# Patient Record
Sex: Male | Born: 1950 | Race: White | Hispanic: No | Marital: Married | Smoking: Never smoker
Health system: Southern US, Community
[De-identification: ages and names within clinical notes are randomized; demographics above are authoritative.]

## PROBLEM LIST (undated history)

## (undated) ENCOUNTER — Emergency Department (HOSPITAL_COMMUNITY): Disposition: A | Discharge status: Other Acute Inpatient Hospital | Payer: Self-pay

## (undated) DIAGNOSIS — F32A Depression, unspecified: Secondary | ICD-10-CM

## (undated) DIAGNOSIS — E291 Testicular hypofunction: Secondary | ICD-10-CM

## (undated) DIAGNOSIS — T7840XA Allergy, unspecified, initial encounter: Secondary | ICD-10-CM

## (undated) DIAGNOSIS — J45909 Unspecified asthma, uncomplicated: Secondary | ICD-10-CM

## (undated) DIAGNOSIS — E669 Obesity, unspecified: Secondary | ICD-10-CM

## (undated) DIAGNOSIS — I10 Essential (primary) hypertension: Secondary | ICD-10-CM

## (undated) DIAGNOSIS — H3320 Serous retinal detachment, unspecified eye: Secondary | ICD-10-CM

## (undated) DIAGNOSIS — K649 Unspecified hemorrhoids: Secondary | ICD-10-CM

## (undated) DIAGNOSIS — F329 Major depressive disorder, single episode, unspecified: Secondary | ICD-10-CM

## (undated) DIAGNOSIS — M545 Low back pain, unspecified: Secondary | ICD-10-CM

## (undated) DIAGNOSIS — D126 Benign neoplasm of colon, unspecified: Secondary | ICD-10-CM

## (undated) DIAGNOSIS — J338 Other polyp of sinus: Secondary | ICD-10-CM

## (undated) DIAGNOSIS — R011 Cardiac murmur, unspecified: Secondary | ICD-10-CM

## (undated) DIAGNOSIS — G8929 Other chronic pain: Secondary | ICD-10-CM

## (undated) DIAGNOSIS — F419 Anxiety disorder, unspecified: Secondary | ICD-10-CM

## (undated) DIAGNOSIS — K219 Gastro-esophageal reflux disease without esophagitis: Secondary | ICD-10-CM

## (undated) DIAGNOSIS — J189 Pneumonia, unspecified organism: Secondary | ICD-10-CM

## (undated) DIAGNOSIS — N1330 Unspecified hydronephrosis: Secondary | ICD-10-CM

## (undated) DIAGNOSIS — M1990 Unspecified osteoarthritis, unspecified site: Secondary | ICD-10-CM

## (undated) DIAGNOSIS — R7303 Prediabetes: Secondary | ICD-10-CM

## (undated) DIAGNOSIS — Z8679 Personal history of other diseases of the circulatory system: Secondary | ICD-10-CM

## (undated) DIAGNOSIS — G4733 Obstructive sleep apnea (adult) (pediatric): Secondary | ICD-10-CM

## (undated) HISTORY — PX: TONSILLECTOMY AND ADENOIDECTOMY: SUR1326

## (undated) HISTORY — DX: Unspecified asthma, uncomplicated: J45.909

## (undated) HISTORY — PX: RETINAL DETACHMENT SURGERY: SHX105

## (undated) HISTORY — DX: Obstructive sleep apnea (adult) (pediatric): G47.33

## (undated) HISTORY — DX: Testicular hypofunction: E29.1

## (undated) HISTORY — DX: Unspecified hemorrhoids: K64.9

## (undated) HISTORY — DX: Allergy, unspecified, initial encounter: T78.40XA

## (undated) HISTORY — DX: Other polyp of sinus: J33.8

## (undated) HISTORY — PX: COLONOSCOPY W/ BIOPSIES: SHX1374

## (undated) HISTORY — PX: VASECTOMY: SHX75

## (undated) HISTORY — DX: Anxiety disorder, unspecified: F41.9

## (undated) HISTORY — PX: URETHROTOMY: SHX1083

## (undated) HISTORY — DX: Depression, unspecified: F32.A

## (undated) HISTORY — DX: Unspecified osteoarthritis, unspecified site: M19.90

## (undated) HISTORY — PX: NASAL RECONSTRUCTION WITH SEPTAL REPAIR: SHX5665

## (undated) HISTORY — DX: Gastro-esophageal reflux disease without esophagitis: K21.9

## (undated) HISTORY — DX: Benign neoplasm of colon, unspecified: D12.6

## (undated) HISTORY — DX: Essential (primary) hypertension: I10

## (undated) HISTORY — PX: CARPAL TUNNEL RELEASE: SHX101

## (undated) HISTORY — DX: Serous retinal detachment, unspecified eye: H33.20

## (undated) HISTORY — DX: Obesity, unspecified: E66.9

## (undated) HISTORY — DX: Major depressive disorder, single episode, unspecified: F32.9

---

## 1998-03-30 ENCOUNTER — Ambulatory Visit (HOSPITAL_COMMUNITY): Admission: RE | Admit: 1998-03-30 | Discharge: 1998-03-30 | Payer: Self-pay | Admitting: Family Medicine

## 2002-11-12 HISTORY — PX: CATARACT EXTRACTION W/ INTRAOCULAR LENS  IMPLANT, BILATERAL: SHX1307

## 2003-02-26 ENCOUNTER — Emergency Department (HOSPITAL_COMMUNITY): Admission: EM | Admit: 2003-02-26 | Discharge: 2003-02-26 | Payer: Self-pay | Admitting: Emergency Medicine

## 2003-02-26 ENCOUNTER — Encounter: Payer: Self-pay | Admitting: *Deleted

## 2003-03-21 ENCOUNTER — Emergency Department (HOSPITAL_COMMUNITY): Admission: EM | Admit: 2003-03-21 | Discharge: 2003-03-21 | Payer: Self-pay | Admitting: Emergency Medicine

## 2003-04-08 ENCOUNTER — Ambulatory Visit (HOSPITAL_BASED_OUTPATIENT_CLINIC_OR_DEPARTMENT_OTHER): Admission: RE | Admit: 2003-04-08 | Discharge: 2003-04-08 | Payer: Self-pay | Admitting: Critical Care Medicine

## 2004-01-05 ENCOUNTER — Ambulatory Visit (HOSPITAL_COMMUNITY): Admission: RE | Admit: 2004-01-05 | Discharge: 2004-01-05 | Payer: Self-pay | Admitting: Endocrinology

## 2004-09-13 ENCOUNTER — Ambulatory Visit: Payer: Self-pay | Admitting: Internal Medicine

## 2004-09-14 ENCOUNTER — Ambulatory Visit: Payer: Self-pay

## 2004-09-29 ENCOUNTER — Ambulatory Visit: Payer: Self-pay | Admitting: Internal Medicine

## 2004-10-12 ENCOUNTER — Ambulatory Visit: Payer: Self-pay | Admitting: Internal Medicine

## 2004-12-15 ENCOUNTER — Ambulatory Visit: Payer: Self-pay | Admitting: Internal Medicine

## 2004-12-18 ENCOUNTER — Ambulatory Visit (HOSPITAL_COMMUNITY): Admission: RE | Admit: 2004-12-18 | Discharge: 2004-12-18 | Payer: Self-pay | Admitting: Internal Medicine

## 2005-01-31 ENCOUNTER — Ambulatory Visit: Payer: Self-pay | Admitting: Internal Medicine

## 2005-02-12 ENCOUNTER — Ambulatory Visit: Payer: Self-pay | Admitting: Internal Medicine

## 2005-02-19 ENCOUNTER — Ambulatory Visit: Payer: Self-pay | Admitting: Internal Medicine

## 2005-02-21 ENCOUNTER — Encounter: Admission: RE | Admit: 2005-02-21 | Discharge: 2005-02-21 | Payer: Self-pay | Admitting: Internal Medicine

## 2005-02-21 ENCOUNTER — Ambulatory Visit: Payer: Self-pay | Admitting: Internal Medicine

## 2005-04-27 ENCOUNTER — Ambulatory Visit: Payer: Self-pay | Admitting: Internal Medicine

## 2005-05-09 ENCOUNTER — Encounter: Admission: RE | Admit: 2005-05-09 | Discharge: 2005-05-09 | Payer: Self-pay | Admitting: Otolaryngology

## 2005-05-09 ENCOUNTER — Ambulatory Visit: Payer: Self-pay | Admitting: Internal Medicine

## 2005-05-14 ENCOUNTER — Encounter (INDEPENDENT_AMBULATORY_CARE_PROVIDER_SITE_OTHER): Payer: Self-pay | Admitting: *Deleted

## 2005-05-14 ENCOUNTER — Ambulatory Visit (HOSPITAL_COMMUNITY): Admission: RE | Admit: 2005-05-14 | Discharge: 2005-05-15 | Payer: Self-pay | Admitting: Otolaryngology

## 2005-05-14 HISTORY — PX: NASAL SEPTUM SURGERY: SHX37

## 2005-06-12 ENCOUNTER — Ambulatory Visit: Payer: Self-pay | Admitting: Internal Medicine

## 2005-08-02 ENCOUNTER — Ambulatory Visit: Payer: Self-pay | Admitting: Internal Medicine

## 2005-09-05 ENCOUNTER — Ambulatory Visit: Payer: Self-pay | Admitting: Internal Medicine

## 2005-09-25 ENCOUNTER — Ambulatory Visit: Payer: Self-pay | Admitting: Internal Medicine

## 2005-10-15 ENCOUNTER — Ambulatory Visit: Payer: Self-pay | Admitting: Internal Medicine

## 2005-10-24 ENCOUNTER — Ambulatory Visit: Payer: Self-pay | Admitting: Internal Medicine

## 2005-12-10 ENCOUNTER — Ambulatory Visit: Payer: Self-pay | Admitting: Internal Medicine

## 2006-02-14 ENCOUNTER — Ambulatory Visit: Payer: Self-pay | Admitting: Internal Medicine

## 2006-02-19 ENCOUNTER — Ambulatory Visit: Payer: Self-pay | Admitting: Internal Medicine

## 2006-04-01 ENCOUNTER — Ambulatory Visit: Payer: Self-pay | Admitting: Internal Medicine

## 2006-04-30 ENCOUNTER — Ambulatory Visit: Payer: Self-pay | Admitting: Internal Medicine

## 2006-06-13 ENCOUNTER — Ambulatory Visit: Payer: Self-pay | Admitting: Internal Medicine

## 2006-08-06 ENCOUNTER — Ambulatory Visit: Payer: Self-pay | Admitting: Internal Medicine

## 2006-08-24 ENCOUNTER — Ambulatory Visit (HOSPITAL_COMMUNITY): Admission: RE | Admit: 2006-08-24 | Discharge: 2006-08-24 | Payer: Self-pay | Admitting: Family Medicine

## 2006-08-24 ENCOUNTER — Ambulatory Visit: Payer: Self-pay | Admitting: Family Medicine

## 2006-08-27 ENCOUNTER — Ambulatory Visit: Payer: Self-pay | Admitting: Internal Medicine

## 2006-09-09 ENCOUNTER — Ambulatory Visit (HOSPITAL_COMMUNITY): Admission: RE | Admit: 2006-09-09 | Discharge: 2006-09-09 | Payer: Self-pay | Admitting: General Practice

## 2006-09-09 ENCOUNTER — Ambulatory Visit: Payer: Self-pay | Admitting: Internal Medicine

## 2006-09-27 ENCOUNTER — Ambulatory Visit: Payer: Self-pay | Admitting: Internal Medicine

## 2006-12-12 ENCOUNTER — Ambulatory Visit: Payer: Self-pay | Admitting: Internal Medicine

## 2007-02-05 ENCOUNTER — Ambulatory Visit: Payer: Self-pay | Admitting: Internal Medicine

## 2007-02-05 LAB — CONVERTED CEMR LAB
ALT: 32 units/L (ref 0–40)
Albumin: 3.6 g/dL (ref 3.5–5.2)
Alkaline Phosphatase: 47 units/L (ref 39–117)
BUN: 16 mg/dL (ref 6–23)
Basophils Absolute: 0.1 10*3/uL (ref 0.0–0.1)
Basophils Relative: 1.1 % — ABNORMAL HIGH (ref 0.0–1.0)
Bilirubin Urine: NEGATIVE
CO2: 27 meq/L (ref 19–32)
Calcium: 8.9 mg/dL (ref 8.4–10.5)
Crystals: NEGATIVE
GFR calc Af Amer: 129 mL/min
GFR calc non Af Amer: 106 mL/min
Lymphocytes Relative: 26.2 % (ref 12.0–46.0)
MCHC: 34.4 g/dL (ref 30.0–36.0)
Monocytes Relative: 10.7 % (ref 3.0–11.0)
Neutro Abs: 2.6 10*3/uL (ref 1.4–7.7)
Platelets: 265 10*3/uL (ref 150–400)
Potassium: 4.3 meq/L (ref 3.5–5.1)
Specific Gravity, Urine: >=1.03 (ref 1.000–1.03)
TSH: 2.61 microintl units/mL (ref 0.35–5.50)
Total CHOL/HDL Ratio: 3.6
Total Protein, Urine: NEGATIVE mg/dL
Triglycerides: 87 mg/dL (ref 0–149)
Urine Glucose: NEGATIVE mg/dL
VLDL: 17 mg/dL (ref 0–40)
pH: 6 (ref 5.0–8.0)

## 2007-02-12 ENCOUNTER — Ambulatory Visit: Payer: Self-pay | Admitting: Internal Medicine

## 2007-04-07 ENCOUNTER — Emergency Department (HOSPITAL_COMMUNITY): Admission: EM | Admit: 2007-04-07 | Discharge: 2007-04-07 | Payer: Self-pay | Admitting: Emergency Medicine

## 2007-04-08 ENCOUNTER — Ambulatory Visit: Payer: Self-pay | Admitting: Internal Medicine

## 2007-04-16 ENCOUNTER — Ambulatory Visit: Payer: Self-pay | Admitting: Internal Medicine

## 2007-05-02 ENCOUNTER — Ambulatory Visit: Payer: Self-pay | Admitting: Internal Medicine

## 2007-05-22 ENCOUNTER — Ambulatory Visit: Payer: Self-pay | Admitting: Internal Medicine

## 2007-05-27 ENCOUNTER — Ambulatory Visit: Payer: Self-pay | Admitting: Internal Medicine

## 2007-06-05 ENCOUNTER — Ambulatory Visit: Payer: Self-pay | Admitting: Internal Medicine

## 2007-06-05 LAB — CONVERTED CEMR LAB
BUN: 21 mg/dL (ref 6–23)
CO2: 31 meq/L (ref 19–32)
Calcium: 9.3 mg/dL (ref 8.4–10.5)
Chloride: 102 meq/L (ref 96–112)
Creatinine, Ser: 1 mg/dL (ref 0.4–1.5)
Hgb A1c MFr Bld: 5.7 % (ref 4.6–6.0)
Vit D, 1,25-Dihydroxy: 27 (ref 20–57)

## 2007-06-11 ENCOUNTER — Ambulatory Visit: Payer: Self-pay | Admitting: Internal Medicine

## 2007-06-18 ENCOUNTER — Ambulatory Visit: Payer: Self-pay | Admitting: Internal Medicine

## 2007-07-09 ENCOUNTER — Ambulatory Visit: Payer: Self-pay | Admitting: Internal Medicine

## 2007-07-30 ENCOUNTER — Ambulatory Visit: Payer: Self-pay | Admitting: Internal Medicine

## 2007-09-09 ENCOUNTER — Ambulatory Visit: Payer: Self-pay | Admitting: Internal Medicine

## 2007-10-17 ENCOUNTER — Telehealth: Payer: Self-pay | Admitting: Internal Medicine

## 2007-10-21 ENCOUNTER — Encounter: Payer: Self-pay | Admitting: Internal Medicine

## 2007-12-22 ENCOUNTER — Telehealth: Payer: Self-pay | Admitting: Internal Medicine

## 2008-01-14 ENCOUNTER — Ambulatory Visit: Payer: Self-pay | Admitting: Internal Medicine

## 2008-01-14 LAB — CONVERTED CEMR LAB
ALT: 22 units/L (ref 0–53)
Basophils Relative: 1.1 % — ABNORMAL HIGH (ref 0.0–1.0)
Bilirubin Urine: NEGATIVE
Bilirubin, Direct: 0.2 mg/dL (ref 0.0–0.3)
CO2: 29 meq/L (ref 19–32)
Cholesterol: 157 mg/dL (ref 0–200)
Crystals: NEGATIVE
Eosinophils Absolute: 0.4 10*3/uL (ref 0.0–0.6)
Eosinophils Relative: 5.7 % — ABNORMAL HIGH (ref 0.0–5.0)
GFR calc Af Amer: 99 mL/min
GFR calc non Af Amer: 82 mL/min
Glucose, Bld: 100 mg/dL — ABNORMAL HIGH (ref 70–99)
Hemoglobin: 13.7 g/dL (ref 13.0–17.0)
Lymphocytes Relative: 24.8 % (ref 12.0–46.0)
MCV: 95.9 fL (ref 78.0–100.0)
Monocytes Absolute: 0.5 10*3/uL (ref 0.2–0.7)
Mucus, UA: NEGATIVE
Neutro Abs: 4 10*3/uL (ref 1.4–7.7)
Nitrite: NEGATIVE
Platelets: 244 10*3/uL (ref 150–400)
Potassium: 4.9 meq/L (ref 3.5–5.1)
Sodium: 138 meq/L (ref 135–145)
Specific Gravity, Urine: 1.01 (ref 1.000–1.03)
TSH: 1.6 microintl units/mL (ref 0.35–5.50)
Total CHOL/HDL Ratio: 2.6
Total Protein, Urine: NEGATIVE mg/dL
Total Protein: 6.7 g/dL (ref 6.0–8.3)
Triglycerides: 85 mg/dL (ref 0–149)
Urine Glucose: NEGATIVE mg/dL
WBC: 6.6 10*3/uL (ref 4.5–10.5)

## 2008-01-19 ENCOUNTER — Ambulatory Visit: Payer: Self-pay | Admitting: Internal Medicine

## 2008-01-19 DIAGNOSIS — I10 Essential (primary) hypertension: Secondary | ICD-10-CM

## 2008-01-19 DIAGNOSIS — E119 Type 2 diabetes mellitus without complications: Secondary | ICD-10-CM

## 2008-01-19 DIAGNOSIS — K219 Gastro-esophageal reflux disease without esophagitis: Secondary | ICD-10-CM | POA: Insufficient documentation

## 2008-01-19 DIAGNOSIS — M545 Low back pain: Secondary | ICD-10-CM

## 2008-03-09 ENCOUNTER — Ambulatory Visit: Payer: Self-pay | Admitting: Internal Medicine

## 2008-03-09 DIAGNOSIS — N529 Male erectile dysfunction, unspecified: Secondary | ICD-10-CM | POA: Insufficient documentation

## 2008-03-10 LAB — CONVERTED CEMR LAB
BUN: 19 mg/dL (ref 6–23)
GFR calc Af Amer: 112 mL/min
GFR calc non Af Amer: 92 mL/min
Potassium: 4.1 meq/L (ref 3.5–5.1)
Sodium: 142 meq/L (ref 135–145)
Vitamin B-12: 546 pg/mL (ref 211–911)

## 2008-03-29 ENCOUNTER — Ambulatory Visit: Payer: Self-pay | Admitting: Internal Medicine

## 2008-03-29 DIAGNOSIS — R079 Chest pain, unspecified: Secondary | ICD-10-CM

## 2008-04-07 ENCOUNTER — Encounter: Admission: RE | Admit: 2008-04-07 | Discharge: 2008-04-07 | Payer: Self-pay | Admitting: Internal Medicine

## 2008-04-19 ENCOUNTER — Telehealth (INDEPENDENT_AMBULATORY_CARE_PROVIDER_SITE_OTHER): Payer: Self-pay | Admitting: *Deleted

## 2008-04-22 ENCOUNTER — Telehealth: Payer: Self-pay | Admitting: Internal Medicine

## 2008-06-09 ENCOUNTER — Ambulatory Visit: Payer: Self-pay | Admitting: Internal Medicine

## 2008-07-21 ENCOUNTER — Ambulatory Visit: Payer: Self-pay | Admitting: Internal Medicine

## 2008-08-24 ENCOUNTER — Ambulatory Visit: Payer: Self-pay | Admitting: Internal Medicine

## 2008-08-27 ENCOUNTER — Ambulatory Visit: Payer: Self-pay | Admitting: Internal Medicine

## 2008-09-27 ENCOUNTER — Ambulatory Visit: Payer: Self-pay | Admitting: Internal Medicine

## 2008-10-25 ENCOUNTER — Telehealth: Payer: Self-pay | Admitting: Internal Medicine

## 2008-10-27 ENCOUNTER — Ambulatory Visit: Payer: Self-pay | Admitting: Internal Medicine

## 2008-11-22 ENCOUNTER — Telehealth: Payer: Self-pay | Admitting: Internal Medicine

## 2008-12-28 ENCOUNTER — Telehealth: Payer: Self-pay | Admitting: Internal Medicine

## 2009-01-19 ENCOUNTER — Ambulatory Visit: Payer: Self-pay | Admitting: Internal Medicine

## 2009-01-19 LAB — CONVERTED CEMR LAB
ALT: 27 units/L (ref 0–53)
Albumin: 3.9 g/dL (ref 3.5–5.2)
BUN: 11 mg/dL (ref 6–23)
Basophils Relative: 0.5 % (ref 0.0–3.0)
CO2: 28 meq/L (ref 19–32)
Calcium: 9.6 mg/dL (ref 8.4–10.5)
Cholesterol: 205 mg/dL (ref 0–200)
Creatinine, Ser: 0.7 mg/dL (ref 0.4–1.5)
Direct LDL: 70.7 mg/dL
Glucose, Bld: 108 mg/dL — ABNORMAL HIGH (ref 70–99)
HCT: 41.6 % (ref 39.0–52.0)
Hemoglobin: 14.4 g/dL (ref 13.0–17.0)
Ketones, ur: NEGATIVE mg/dL
Leukocytes, UA: NEGATIVE
Lymphocytes Relative: 32.5 % (ref 12.0–46.0)
Monocytes Relative: 11 % (ref 3.0–12.0)
Neutro Abs: 2.1 10*3/uL (ref 1.4–7.7)
Nitrite: NEGATIVE
PSA: 0.31 ng/mL (ref 0.10–4.00)
RBC: 4.3 M/uL (ref 4.22–5.81)
Specific Gravity, Urine: 1.015 (ref 1.000–1.035)
Total CHOL/HDL Ratio: 1.9
Total Protein, Urine: NEGATIVE mg/dL
Total Protein: 7.2 g/dL (ref 6.0–8.3)
pH: 7 (ref 5.0–8.0)

## 2009-01-20 ENCOUNTER — Ambulatory Visit: Payer: Self-pay | Admitting: Internal Medicine

## 2009-01-26 ENCOUNTER — Ambulatory Visit: Payer: Self-pay | Admitting: Internal Medicine

## 2009-01-26 DIAGNOSIS — R209 Unspecified disturbances of skin sensation: Secondary | ICD-10-CM

## 2009-02-02 ENCOUNTER — Ambulatory Visit: Payer: Self-pay | Admitting: Internal Medicine

## 2009-02-23 ENCOUNTER — Telehealth: Payer: Self-pay | Admitting: Internal Medicine

## 2009-03-21 ENCOUNTER — Telehealth: Payer: Self-pay | Admitting: Internal Medicine

## 2009-04-22 ENCOUNTER — Telehealth: Payer: Self-pay | Admitting: Internal Medicine

## 2009-05-23 ENCOUNTER — Ambulatory Visit: Payer: Self-pay | Admitting: Internal Medicine

## 2009-05-23 DIAGNOSIS — F411 Generalized anxiety disorder: Secondary | ICD-10-CM | POA: Insufficient documentation

## 2009-05-23 DIAGNOSIS — F329 Major depressive disorder, single episode, unspecified: Secondary | ICD-10-CM

## 2009-05-25 LAB — CONVERTED CEMR LAB
AST: 26 units/L (ref 0–37)
Albumin: 3.5 g/dL (ref 3.5–5.2)
BUN: 14 mg/dL (ref 6–23)
Calcium: 9.2 mg/dL (ref 8.4–10.5)
GFR calc non Af Amer: 122.95 mL/min (ref >60)
Glucose, Bld: 103 mg/dL — ABNORMAL HIGH (ref 70–99)
Ketones, ur: NEGATIVE mg/dL
Leukocytes, UA: NEGATIVE
Potassium: 4.4 meq/L (ref 3.5–5.1)
Specific Gravity, Urine: 1.02 (ref 1.000–1.030)
TSH: 2.25 microintl units/mL (ref 0.35–5.50)
Urobilinogen, UA: 0.2 (ref 0.0–1.0)

## 2009-06-14 ENCOUNTER — Ambulatory Visit: Payer: Self-pay | Admitting: Internal Medicine

## 2009-06-14 DIAGNOSIS — R21 Rash and other nonspecific skin eruption: Secondary | ICD-10-CM

## 2009-06-22 ENCOUNTER — Ambulatory Visit: Payer: Self-pay | Admitting: Internal Medicine

## 2009-06-22 DIAGNOSIS — M722 Plantar fascial fibromatosis: Secondary | ICD-10-CM

## 2009-07-22 ENCOUNTER — Telehealth: Payer: Self-pay | Admitting: Internal Medicine

## 2009-08-24 ENCOUNTER — Ambulatory Visit: Payer: Self-pay | Admitting: Internal Medicine

## 2009-08-24 DIAGNOSIS — M25569 Pain in unspecified knee: Secondary | ICD-10-CM

## 2009-08-24 DIAGNOSIS — F17200 Nicotine dependence, unspecified, uncomplicated: Secondary | ICD-10-CM

## 2009-09-29 ENCOUNTER — Ambulatory Visit: Payer: Self-pay | Admitting: Internal Medicine

## 2009-09-29 DIAGNOSIS — R062 Wheezing: Secondary | ICD-10-CM | POA: Insufficient documentation

## 2009-10-19 ENCOUNTER — Ambulatory Visit: Payer: Self-pay | Admitting: Internal Medicine

## 2009-10-19 LAB — CONVERTED CEMR LAB
BUN: 12 mg/dL (ref 6–23)
CO2: 33 meq/L — ABNORMAL HIGH (ref 19–32)
Calcium: 9.5 mg/dL (ref 8.4–10.5)
Creatinine, Ser: 0.9 mg/dL (ref 0.4–1.5)
Glucose, Bld: 94 mg/dL (ref 70–99)

## 2009-10-24 ENCOUNTER — Ambulatory Visit: Payer: Self-pay | Admitting: Internal Medicine

## 2009-10-29 ENCOUNTER — Ambulatory Visit: Payer: Self-pay | Admitting: Internal Medicine

## 2009-10-31 ENCOUNTER — Ambulatory Visit: Payer: Self-pay | Admitting: Internal Medicine

## 2009-11-16 ENCOUNTER — Ambulatory Visit: Payer: Self-pay | Admitting: Internal Medicine

## 2009-11-16 DIAGNOSIS — R1310 Dysphagia, unspecified: Secondary | ICD-10-CM | POA: Insufficient documentation

## 2009-12-22 ENCOUNTER — Telehealth: Payer: Self-pay | Admitting: Internal Medicine

## 2010-01-19 ENCOUNTER — Telehealth: Payer: Self-pay | Admitting: Internal Medicine

## 2010-01-19 ENCOUNTER — Ambulatory Visit: Payer: Self-pay | Admitting: Internal Medicine

## 2010-01-19 LAB — CONVERTED CEMR LAB
BUN: 16 mg/dL (ref 6–23)
Basophils Relative: 1.2 % (ref 0.0–3.0)
Bilirubin Urine: NEGATIVE
Calcium: 9 mg/dL (ref 8.4–10.5)
Cholesterol: 184 mg/dL (ref 0–200)
Creatinine, Ser: 0.9 mg/dL (ref 0.4–1.5)
Eosinophils Absolute: 0.4 10*3/uL (ref 0.0–0.7)
GFR calc non Af Amer: 91.79 mL/min (ref >60)
HCT: 40.1 % (ref 39.0–52.0)
Hgb A1c MFr Bld: 5.5 % (ref 4.6–6.5)
LDL Cholesterol: 100 mg/dL — ABNORMAL HIGH (ref 0–99)
Leukocytes, UA: NEGATIVE
Lymphs Abs: 1.4 10*3/uL (ref 0.7–4.0)
MCHC: 33.6 g/dL (ref 30.0–36.0)
MCV: 98 fL (ref 78.0–100.0)
Monocytes Absolute: 0.7 10*3/uL (ref 0.1–1.0)
Neutrophils Relative %: 51.4 % (ref 43.0–77.0)
Nitrite: NEGATIVE
Platelets: 267 10*3/uL (ref 150.0–400.0)
Total Bilirubin: 0.6 mg/dL (ref 0.3–1.2)
Triglycerides: 87 mg/dL (ref 0.0–149.0)
Urine Glucose: NEGATIVE mg/dL
VLDL: 17.4 mg/dL (ref 0.0–40.0)

## 2010-01-27 ENCOUNTER — Ambulatory Visit: Payer: Self-pay | Admitting: Internal Medicine

## 2010-01-31 ENCOUNTER — Ambulatory Visit: Payer: Self-pay | Admitting: Internal Medicine

## 2010-02-02 LAB — CONVERTED CEMR LAB
Bilirubin Urine: NEGATIVE
Hemoglobin, Urine: NEGATIVE
Leukocytes, UA: NEGATIVE
Nitrite: NEGATIVE
pH: 6.5 (ref 5.0–8.0)

## 2010-02-20 ENCOUNTER — Telehealth: Payer: Self-pay | Admitting: Internal Medicine

## 2010-03-14 ENCOUNTER — Ambulatory Visit: Payer: Self-pay | Admitting: Internal Medicine

## 2010-03-14 DIAGNOSIS — M79609 Pain in unspecified limb: Secondary | ICD-10-CM

## 2010-03-31 ENCOUNTER — Ambulatory Visit: Payer: Self-pay | Admitting: Internal Medicine

## 2010-03-31 DIAGNOSIS — G4733 Obstructive sleep apnea (adult) (pediatric): Secondary | ICD-10-CM

## 2010-04-03 LAB — CONVERTED CEMR LAB
Albumin: 3.8 g/dL (ref 3.5–5.2)
Alkaline Phosphatase: 50 units/L (ref 39–117)
BUN: 14 mg/dL (ref 6–23)
Basophils Absolute: 0 10*3/uL (ref 0.0–0.1)
Bilirubin, Direct: 0.1 mg/dL (ref 0.0–0.3)
Calcium: 9.1 mg/dL (ref 8.4–10.5)
GFR calc non Af Amer: 113.21 mL/min (ref >60)
Glucose, Bld: 89 mg/dL (ref 70–99)
Lymphocytes Relative: 27.4 % (ref 12.0–46.0)
Lymphs Abs: 1.7 10*3/uL (ref 0.7–4.0)
Monocytes Relative: 10.4 % (ref 3.0–12.0)
Neutrophils Relative %: 56.1 % (ref 43.0–77.0)
Nitrite: NEGATIVE
Platelets: 251 10*3/uL (ref 150.0–400.0)
Pro B Natriuretic peptide (BNP): 49.6 pg/mL (ref 0.0–100.0)
RDW: 11.9 % (ref 11.5–14.6)
Total Protein, Urine: NEGATIVE mg/dL

## 2010-04-11 ENCOUNTER — Telehealth: Payer: Self-pay | Admitting: Internal Medicine

## 2010-04-11 ENCOUNTER — Encounter: Payer: Self-pay | Admitting: Internal Medicine

## 2010-04-13 ENCOUNTER — Encounter: Payer: Self-pay | Admitting: Internal Medicine

## 2010-04-13 ENCOUNTER — Ambulatory Visit: Payer: Self-pay | Admitting: Pulmonary Disease

## 2010-04-19 ENCOUNTER — Telehealth: Payer: Self-pay | Admitting: Internal Medicine

## 2010-05-22 ENCOUNTER — Ambulatory Visit: Payer: Self-pay | Admitting: Internal Medicine

## 2010-05-22 ENCOUNTER — Telehealth: Payer: Self-pay | Admitting: Internal Medicine

## 2010-05-24 ENCOUNTER — Ambulatory Visit (HOSPITAL_BASED_OUTPATIENT_CLINIC_OR_DEPARTMENT_OTHER): Admission: RE | Admit: 2010-05-24 | Discharge: 2010-05-24 | Payer: Self-pay | Admitting: Pulmonary Disease

## 2010-05-24 ENCOUNTER — Encounter: Payer: Self-pay | Admitting: Pulmonary Disease

## 2010-06-01 ENCOUNTER — Ambulatory Visit: Payer: Self-pay | Admitting: Pulmonary Disease

## 2010-06-13 ENCOUNTER — Telehealth (INDEPENDENT_AMBULATORY_CARE_PROVIDER_SITE_OTHER): Payer: Self-pay | Admitting: *Deleted

## 2010-06-19 ENCOUNTER — Telehealth: Payer: Self-pay | Admitting: Internal Medicine

## 2010-06-28 ENCOUNTER — Telehealth: Payer: Self-pay | Admitting: Internal Medicine

## 2010-07-13 ENCOUNTER — Telehealth: Payer: Self-pay | Admitting: Internal Medicine

## 2010-07-13 ENCOUNTER — Ambulatory Visit: Payer: Self-pay | Admitting: Pulmonary Disease

## 2010-08-04 ENCOUNTER — Ambulatory Visit: Payer: Self-pay | Admitting: Internal Medicine

## 2010-08-08 ENCOUNTER — Encounter: Payer: Self-pay | Admitting: Internal Medicine

## 2010-08-18 ENCOUNTER — Telehealth: Payer: Self-pay | Admitting: Internal Medicine

## 2010-08-31 ENCOUNTER — Telehealth: Payer: Self-pay | Admitting: Internal Medicine

## 2010-09-11 ENCOUNTER — Encounter: Payer: Self-pay | Admitting: Internal Medicine

## 2010-09-15 ENCOUNTER — Telehealth: Payer: Self-pay | Admitting: Internal Medicine

## 2010-10-20 ENCOUNTER — Telehealth: Payer: Self-pay | Admitting: Internal Medicine

## 2010-10-20 ENCOUNTER — Ambulatory Visit: Payer: Self-pay | Admitting: Endocrinology

## 2010-10-20 ENCOUNTER — Encounter: Payer: Self-pay | Admitting: Endocrinology

## 2010-10-20 DIAGNOSIS — N63 Unspecified lump in unspecified breast: Secondary | ICD-10-CM

## 2010-10-20 DIAGNOSIS — E291 Testicular hypofunction: Secondary | ICD-10-CM | POA: Insufficient documentation

## 2010-10-20 LAB — CONVERTED CEMR LAB
Estradiol: 19.9 pg/mL
LH: 6.2 milliintl units/mL (ref 1.5–9.3)
Prolactin: 4.5 ng/mL (ref 2.1–17.1)
hCG, Beta Chain, Quant, S: <2 milliintl units/mL

## 2010-11-02 ENCOUNTER — Encounter
Admission: RE | Admit: 2010-11-02 | Discharge: 2010-11-02 | Payer: Self-pay | Source: Home / Self Care | Attending: Endocrinology | Admitting: Endocrinology

## 2010-11-07 ENCOUNTER — Encounter: Payer: Self-pay | Admitting: Endocrinology

## 2010-11-15 ENCOUNTER — Ambulatory Visit
Admission: RE | Admit: 2010-11-15 | Discharge: 2010-11-15 | Payer: Self-pay | Source: Home / Self Care | Attending: Endocrinology | Admitting: Endocrinology

## 2010-12-06 ENCOUNTER — Ambulatory Visit
Admission: RE | Admit: 2010-12-06 | Discharge: 2010-12-06 | Payer: Self-pay | Source: Home / Self Care | Attending: Internal Medicine | Admitting: Internal Medicine

## 2010-12-06 DIAGNOSIS — R413 Other amnesia: Secondary | ICD-10-CM | POA: Insufficient documentation

## 2010-12-14 ENCOUNTER — Encounter: Payer: Self-pay | Admitting: Pulmonary Disease

## 2010-12-14 NOTE — Progress Notes (Signed)
Summary: PA--Omeprazole  Phone Note From Pharmacy   Summary of Call: PA request--Omeprazole. Form completed and faxed, will await insurance company reply. Initial call taken by: Lucious Groves,  Apr 11, 2010 9:06 AM     Appended Document: PA--Omeprazole approved until 03/2011.

## 2010-12-14 NOTE — Progress Notes (Signed)
Summary: ORDER  Phone Note Call from Patient   Caller: Patient Call For: SOOD Summary of Call: NEED ORDER SENT TO [facility] FOR C PAP MACHINE , HE DOES NOT WANT TO USE [facility] . Initial call taken by: Rickard [name],  June 13, 2010 2:43 PM  Follow-up for Phone Call        will send message to [facility] to address.  Per pt's chart, it looks like VS sent order to [facility] 05-24-2010 to get set up with a DME company along with cpap and supplies.  Looks like order was sent to [facility].  Pt wishing to go to [facility] instead.  PCCs, please advise.  Thanks.  Aundra Millet Reynolds LPN  June 13, 2010 2:57 PM   disregard - pt took care of problem.  per pt. Follow-up by: Eugene Gavia,  June 13, 2010 3:22 PM

## 2010-12-14 NOTE — Progress Notes (Signed)
Summary: Oxycodone   Phone Note Refill Request Message from:  Patient on July 13, 2010 8:40 AM  Refills Requested: Medication #1:  OXYCODONE HCL 15 MG TABS 1 by mouth four times a day   Supply Requested: 1 month has appt with Dr Craige Cotta this am and will come back after appt to pick up rx    Method Requested: Pick up at Office Initial call taken by: Migdalia Dk,  July 13, 2010 8:41 AM  Follow-up for Phone Call        #120 was given on 06-20-10, please advise. Lucious Groves CMA  July 13, 2010 9:09 AM   Additional Follow-up for Phone Call Additional follow up Details #1::        ok to fill on 9-8 Additional Follow-up by: Tresa Garter MD,  July 13, 2010 10:16 AM    Additional Follow-up for Phone Call Additional follow up Details #2::    Pt informed to pick up rx Follow-up by: Lamar Sprinkles, CMA,  July 13, 2010 1:19 PM  New/Updated Medications: OXYCODONE HCL 15 MG TABS (OXYCODONE HCL) 1 by mouth four times a day Fill on or after 07/20/2010 Prescriptions: OXYCODONE HCL 15 MG TABS (OXYCODONE HCL) 1 by mouth four times a day Fill on or after 07/20/2010  #120 x 0   Entered by:   Lamar Sprinkles, CMA   Authorized by:   Tresa Garter MD   Signed by:   Lamar Sprinkles, CMA on 07/13/2010   Method used:   Print then Give to Patient   RxID:   [id number]

## 2010-12-14 NOTE — Assessment & Plan Note (Signed)
Summary: EXTREME FATIGUE/PLOT/PLOT VAC/CD   Vital Signs:  Patient profile:   60 year old male Height:      67 inches Weight:      295.75 pounds BMI:     46.49 O2 Sat:      96 % on Room air Temp:     98.5 degrees F oral Pulse rate:   104 / minute BP sitting:   128 / 88  (left arm) Cuff size:   large  Vitals Entered By: Zella Ball Ewing CMA (AAMA) (May 22, 2010 4:40 PM)  O2 Flow:  Room air CC: Fatigue for 3 to 4 weeks, back pain increased/RE   Primary Care Provider:  Tresa Garter MD  CC:  Fatigue for 3 to 4 weeks and back pain increased/RE.  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  Pt denies CP, sob, doe, wheezing, orthopnea, pnd, worsening LE edema, palps, dizziness or syncope  Pt denies new neuro symptoms such as headache, facial or extremity weakness   No fever, n/v/d, abd pain, bowel change, blood.  No unusual wt loss, loss of appetitie, night sweats, suicidla ideation, panic or other constiutional symtpoms.  Overall o/w good complaicne with meds, good tolerance.  Does not feel he needs counseling at this time.  No LE pain, weakness, numb. No bowel or bladder changes.  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) 12)  Depression  (ICD-311) 13)   Anxiety  (ICD-300.00) 14)  Paresthesia  (ICD-782.0) 15)  Well Adult Exam  (ICD-V70.0) 16)  Accidental Poisoning By Chloral Hydrate Group  (ICD-E852.0) 17)  Asthmatic Bronchitis, Acute  (ICD-466.0) 18)  Bronchitis, Acute  (ICD-466.0) 19)  Back Pain  (ICD-724.5) 20)  Cellulitis  (ICD-682.9) 21)  Elbow Pain  (ICD-719.42) 22)  Chest Pain, Unspecified  (ICD-786.50) 23)  Erectile Dysfunction  (ICD-607.84) 24)  Obesity, Morbid  (ICD-278.01) 25)  Hypertension  (ICD-401.9) 26)  Low Back Pain  (ICD-724.2) 27)  Gerd  (ICD-530.81) 28)  Diabetes Mellitus, Type II  (ICD-250.00) 29)  Routine General Medical Exam@health  Care Facl  (ICD-V70.0)  Medications Prior to Update: 1)  Aldactazide 25-25 Mg Tabs (Spironolactone-Hctz) .Marland Kitchen.. 1 By Mouth Qam For Blood Pressure 2)  Oxycodone Hcl 15 Mg Tabs (Oxycodone Hcl) .Marland Kitchen.. 1 By Mouth Four Times A Day 3)  Xanax 0.5 Mg Tabs (Alprazolam) .Marland Kitchen.. 1 By Mouth Two Times A Day As Needed Anxiety 4)  Ambien 10 Mg Tabs (Zolpidem Tartrate) .Marland Kitchen.. 1 At Bedtime As Needed 5)  Melatonin 5 Mg Tabs (Melatonin) .Marland Kitchen.. 1 At Bedtime For Sleep 6)  Nuvigil 150 Mg Tabs (Armodafinil) .Marland Kitchen.. 1 By Mouth Qam For Being More Alert 7)  Cialis 20 Mg Tabs (Tadalafil) .Marland Kitchen.. 1 Tablet Every Other Day As Needed 8)  Vimovo 500-20 Mg  Tbec (Naproxen-Esomeprazole) .Marland Kitchen.. 1 By Mouth Once Daily - Two Times A Day Pc As Needed Pain 9)  Pennsaid 1.5 % Soln (Diclofenac Sodium) .... 3-5 Gtt On Skin Three Times A Day For Pain 10)  Vitamin D3 1000 Unit  Tabs (Cholecalciferol) .Marland Kitchen.. 1 By Mouth Daily  Current Medications (verified): 1)  Aldactazide 25-25 Mg Tabs (Spironolactone-Hctz) .Marland Kitchen.. 1 By Mouth Qam For Blood Pressure 2)  Oxycodone Hcl 15 Mg Tabs (Oxycodone Hcl) .Marland Kitchen.. 1 By Mouth Four Times A Day 3)  Xanax 0.5 Mg Tabs (Alprazolam) .Marland Kitchen.. 1 By Mouth Two Times A Day As Needed Anxiety 4)  Ambien 10 Mg Tabs (Zolpidem Tartrate) .Marland Kitchen.. 1 At Bedtime As Needed 5)  Melatonin 5 Mg Tabs (Melatonin) .Marland Kitchen.. 1 At Bedtime For Sleep 6)   Nuvigil 150 Mg Tabs (Armodafinil) .Marland Kitchen.. 1 By Mouth Qam For Being More Alert 7)  Cialis 20 Mg Tabs (Tadalafil) .Marland Kitchen.. 1 Tablet Every Other Day As Needed 8)  Vimovo 500-20 Mg Tbec (Naproxen-Esomeprazole) .Marland Kitchen.. 1 By Mouth Once Daily - Two Times A Day Pc As Needed Pain 9)  Pennsaid 1.5 % Soln (Diclofenac Sodium) .... 3-5 Gtt On Skin Three Times A Day For Pain 10)  Vitamin D3 1000 Unit  Tabs (Cholecalciferol) .Marland Kitchen.. 1 By Mouth Daily 11)  Flexeril 5 Mg Tabs (Cyclobenzaprine Hcl) .Marland Kitchen.. 1 By Mouth Three Times A Day As Needed 12)  Wellbutrin Xl 150 Mg Xr24h-Tab (Bupropion Hcl) .Marland Kitchen.. 1 By Mouth Once Daily - Generic 13)  Phentermine Hcl 37.5 Mg Caps (Phentermine Hcl) .Marland Kitchen.. 1 By Mouth Once Daily  Allergies (verified): 1)  ! Erythromycin (Erythromycin)  Past History:  Past Medical History: Last updated: 03/31/2010 OSA (Stopped CPAP 2009) Diabetes mellitus, type II GERD Low back pain R eye ret. 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  , fatigue but non toxic Head:  normocephalic and atraumatic.   Eyes:  vision grossly intact, pupils equal, and pupils round.   Ears:  R ear normal and L ear normal.   Nose:  no external deformity and no nasal discharge.   Mouth:  no gingival abnormalities and pharynx pink and moist.   Neck:  supple and no masses.   Lungs:  normal respiratory effort and normal breath sounds.   Heart:  normal rate and regular rhythm.   Abdomen:   soft, non-tender, and normal bowel sounds.   Msk:  no spine tender except for chronic upper lumbar midline tender without paravertebral tender , swelling or eryhema or drainage. Extremities:  no edema, no erythema  Neurologic:  strength normal in all extremities and gait normal.     Impression & Recommendations:  Problem # 1:  FATIGUE (ICD-780.79) exam benign, suspect primarily worsening OSA related to ongoing wt gain and recurrent depression;  urged complaicne with keeping appt for study;  exam o/w benign  Problem # 2:  DEPRESSION (ICD-311)  His updated medication list for this problem includes:    Xanax 0.5 Mg Tabs (Alprazolam) .Marland Kitchen... 1 by mouth two times a day as needed  anxiety    Wellbutrin Xl 150 Mg Xr24h-tab (Bupropion hcl) .Marland Kitchen... 1 by mouth once daily - generic to re-start the wellbutrin er 150 once daily   Problem # 3:  BACK PAIN (ICD-724.5)  His updated medication list for this problem includes:    Oxycodone Hcl 15 Mg Tabs (Oxycodone hcl) .Marland Kitchen... 1 by mouth four times a day    Vimovo 500-20 Mg Tbec (Naproxen-esomeprazole) .Marland Kitchen... 1 by mouth once daily - two times a day pc as needed pain    Flexeril 5 Mg Tabs (Cyclobenzaprine hcl) .Marland Kitchen... 1 by mouth three times a day as needed upper mid lumbar, chronic  - for med refill today, and trial flexeril three times a day as needed   Problem # 4:  OBESITY, MORBID (ICD-278.01) suspect majority of his fatigue, depression and pain related to this ; most recent labs reviewed, TSH normal;  due to his dire straits related and work difficulty will add phentermine limited rx for wt loss  Complete Medication List: 1)  Aldactazide 25-25 Mg Tabs (Spironolactone-hctz) .Marland Kitchen.. 1 by mouth qam for blood pressure 2)  Oxycodone Hcl 15 Mg Tabs (Oxycodone hcl) .Marland Kitchen.. 1 by mouth four times a day 3)  Xanax 0.5 Mg Tabs (Alprazolam) .Marland Kitchen.. 1 by mouth two times a day as needed anxiety 4)  Ambien 10 Mg Tabs (Zolpidem tartrate) .Marland Kitchen.. 1 at bedtime as needed 5)  Melatonin 5 Mg  Tabs (Melatonin) .Marland Kitchen.. 1 at bedtime for sleep 6)  Nuvigil 150 Mg Tabs (Armodafinil) .Marland Kitchen.. 1 by mouth qam for being more alert 7)  Cialis 20 Mg Tabs (Tadalafil) .Marland Kitchen.. 1 tablet every other day as needed 8)  Vimovo 500-20 Mg Tbec (Naproxen-esomeprazole) .Marland Kitchen.. 1 by mouth once daily - two times a day pc as needed pain 9)  Pennsaid 1.5 % Soln (Diclofenac sodium) .... 3-5 gtt on skin three times a day for pain 10)  Vitamin D3 1000 Unit Tabs (Cholecalciferol) .Marland Kitchen.. 1 by mouth daily 11)  Flexeril 5 Mg Tabs (Cyclobenzaprine hcl) .Marland Kitchen.. 1 by mouth three times a day as needed 12)  Wellbutrin Xl 150 Mg Xr24h-tab (Bupropion hcl) .Marland Kitchen.. 1 by mouth once daily - generic 13)  Phentermine Hcl 37.5 Mg Caps (Phentermine hcl) .Marland Kitchen.. 1 by mouth once daily  Patient Instructions: 1)  Please take all new medications as prescribed  2)  Continue all previous medications as before this visit  3)  please keep your appt for sleep test later this week 4)  Please schedule an appointment with your primary doctor as needed Prescriptions: PHENTERMINE HCL 37.5 MG CAPS (PHENTERMINE HCL) 1 by mouth once daily  #30 x 2   Entered and Authorized by:   Corwin Levins MD   Signed by:   Corwin Levins MD on 05/22/2010   Method used:   Print then Give to Patient   RxID:   [id number] WELLBUTRIN XL 150 MG XR24H-TAB (BUPROPION HCL) 1 by mouth once daily - generic  #90 x 3   Entered and Authorized by:   Corwin Levins MD   Signed by:   Corwin Levins MD on 05/22/2010   Method used:   Print then Give to Patient   RxID:   [id number] OXYCODONE HCL 15 MG TABS (OXYCODONE HCL) 1 by mouth four times a day  #120 x 0   Entered and Authorized by:   Corwin Levins MD   Signed by:   Corwin Levins MD on 05/22/2010   Method used:   Print  then Give to Patient   RxID:   [id number] FLEXERIL 5 MG TABS (CYCLOBENZAPRINE HCL) 1 by mouth three times a day as needed  #60 x 1   Entered and Authorized by:   Corwin Levins MD   Signed by:   Corwin Levins MD on  05/22/2010   Method used:   Print then Give to Patient   RxID:   [id number]

## 2010-12-14 NOTE — Consult Note (Signed)
Summary: [facility]   Imported By: Lester Hannibal 11/21/2010 08:42:43  _____________________________________________________________________  External Attachment:    Type:   Image     Comment:   External Document

## 2010-12-14 NOTE — Progress Notes (Signed)
Summary: REFILL  Phone Note Refill Request Call back at [phone]   Refills Requested: Medication #1:  OXYCODONE HCL 15 MG  TABS 1 qid as needed pain fill on or after 11/24/2009 Initial call taken by: Lamar Sprinkles, CMA,  December 22, 2009 5:37 PM  Follow-up for Phone Call        OK Follow-up by: Tresa Garter MD,  December 23, 2009 7:51 AM  Additional Follow-up for Phone Call Additional follow up Details #1::        lmovm to pick up Additional Follow-up by: Rock Nephew CMA,  December 23, 2009 2:50 PM    New/Updated Medications: OXYCODONE HCL 15 MG  TABS (OXYCODONE HCL) 1 qid as needed pain fill on or after 12/25/2009 Prescriptions: OXYCODONE HCL 15 MG  TABS (OXYCODONE HCL) 1 qid as needed pain fill on or after 12/25/2009  #120 x 0   Entered by:   Rock Nephew CMA   Authorized by:   Tresa Garter MD   Signed by:   Rock Nephew CMA on 12/23/2009   Method used:   Print then Give to Patient   RxID:   [id number]

## 2010-12-14 NOTE — Assessment & Plan Note (Signed)
Summary: PER PT [facility]   Vital Signs:  Patient profile:   60 year old male Weight:      301 pounds Temp:     97 degrees F oral Pulse rate:   70 / minute BP sitting:   130 / 94  (left arm)  Vitals Entered By: Tora Perches (January 27, 2010 8:32 AM)  History of Present Illness: The patient presents for a wellness examination   Preventive Screening-Counseling & Management  Caffeine-Diet-Exercise     Does Patient Exercise: no  Current Medications (verified): 1)  Omeprazole 20 Mg Cpdr (Omeprazole) .Marland Kitchen.. 1 By Mouth Qd 2)  Oxycodone Hcl 15 Mg  Tabs (Oxycodone Hcl) .Marland Kitchen.. 1 Qid As Needed Pain Fill On or After 01/22/2010 3)  Cialis 20 Mg Tabs (Tadalafil) .Marland Kitchen.. 1 Tablet Every Other Day As Needed 4)  Vitamin D3 1000 Unit  Tabs (Cholecalciferol) .Marland Kitchen.. 1 By Mouth Daily 5)  Xanax 0.5 Mg Tabs (Alprazolam) .Marland Kitchen.. 1 By Mouth Two Times A Day As Needed Anxiety 6)  Hydrochlorothiazide 12.5 Mg  Tabs (Hydrochlorothiazide) .... Take 1 Tab  By Mouth Every Morning  Allergies: 1)  ! Erythromycin (Erythromycin)  Past History:  Past Medical History: Last updated: 10/29/2009 OSA Diabetes mellitus, type II GERD Low back pain R eye ret. 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  The patient denies anorexia, fever, weight loss, vision loss, decreased hearing, hoarseness, chest pain, syncope, peripheral edema, prolonged cough, headaches, hemoptysis, abdominal pain, melena, hematochezia, severe indigestion/heartburn, hematuria, incontinence, genital sores,  muscle weakness, suspicious skin lesions, transient blindness, difficulty walking, depression, unusual weight change, abnormal bleeding, enlarged lymph nodes, angioedema, and testicular masses.         LBP  Physical Exam  General:  alert and overweight-appearing.   Nose:  mild congestion Mouth:  Erythematous throat mucosa and intranasal erythema.  Lungs:  B no ronchi Heart:  RRR Abdomen:  S/NT Rectal:  no external abnormalities, normal sphincter tone, and no masses.  G(-) stool Genitalia:  Testes bilaterally descended without nodularity, tenderness or masses. No scrotal masses or lesions. No penis lesions or urethral discharge. Prostate:  1+ enlarged.   Msk:  right plantar heel marked tender with callous and mild erythema, but ulcers or swelling L knee is tender Pulses:  R and L carotid,radial,femoral,dorsalis pedis and posterior tibial pulses are full and equal bilaterally Extremities:  No edematrace left pedal edema and trace right pedal edema.   Neurologic:  grosslly non focal  Skin:   no ecchymoses, and no petechiae.   Cervical Nodes:  No lymphadenopathy noted Psych:  Oriented X3, good eye contact, and not anxious appearing.     Impression & Recommendations:  Problem # 1:  WELL ADULT EXAM (ICD-V70.0) Assessment New Health and age related issues were discussed. Available screening tests and vaccinations were discussed as well. Healthy life style including good diet and execise was discussed. The labs were reviewed with the patient.  Orders: EKG w/ Interpretation (93000)  Problem # 2:  BACK PAIN (ICD-724.5) Assessment: Unchanged  His updated medication list for this problem includes:    Oxycodone  Hcl 15 Mg Tabs (Oxycodone hcl) .Marland Kitchen... 1 qid as needed pain fill on or after 01/22/2010  Problem # 3:  OBESITY, MORBID (ICD-278.01) Assessment: Deteriorated LapBand discussed again - declined Ht: 67 (10/31/2009)   Wt: 301 (01/27/2010)   BMI: 43.86 (10/31/2009)  Complete Medication  List: 1)  Omeprazole 20 Mg Cpdr (Omeprazole) .Marland Kitchen.. 1 by mouth qd 2)  Oxycodone Hcl 15 Mg Tabs (Oxycodone hcl) .Marland Kitchen.. 1 qid as needed pain fill on or after 01/22/2010 3)  Cialis 20 Mg Tabs (Tadalafil) .Marland Kitchen.. 1 tablet every other day as needed 4)  Vitamin D3 1000 Unit Tabs (Cholecalciferol) .Marland Kitchen.. 1 by mouth daily 5)  Xanax 0.5 Mg Tabs (Alprazolam) .Marland Kitchen.. 1 by mouth two times a day as needed anxiety 6)  Hydrochlorothiazide 12.5 Mg Tabs (Hydrochlorothiazide) .... Take 1 tab  by mouth every morning  Patient Instructions: 1)  Please schedule a follow-up appointment in 3 months. 2)  You need to lose weight. Consider a lower calorie diet and regular exercise.  Prescriptions: XANAX 0.5 MG TABS (ALPRAZOLAM) 1 by mouth two times a day as needed anxiety  #60 x 3   Entered and Authorized by:   Tresa Garter MD   Signed by:   Tresa Garter MD on 01/27/2010   Method used:   Print then Give to Patient   RxID:   [id number]

## 2010-12-14 NOTE — Miscellaneous (Signed)
[facility]   Imported By: Lester Flourtown 02/03/2010 10:16:47  _____________________________________________________________________  External Attachment:    Type:   Image     Comment:   External Document

## 2010-12-14 NOTE — Medication Information (Signed)
Summary: Prilosec Approved/[facility]  Prilosec Approved/[facility]   Imported By: Sherian Rein 04/18/2010 12:09:42  _____________________________________________________________________  External Attachment:    Type:   Image     Comment:   External Document  Appended Document: Prilosec Approved/[facility] MD aware, signed doc.

## 2010-12-14 NOTE — Assessment & Plan Note (Signed)
Summary: KIDNEY INFECTION/NWS   Vital Signs:  Patient profile:   60 year old male Weight:      301 pounds Temp:     97.8 degrees F oral Pulse rate:   71 / minute BP sitting:   126 / 100  (left arm)  Vitals Entered By: Tora Perches (January 31, 2010 10:48 AM) CC: kidney infection   CC:  kidney infection.  History of Present Illness: C/o sharp severe stabbing pain in R flank x starting 1 am - off and on. No fever. No N/V.  Preventive Screening-Counseling & Management  Alcohol-Tobacco     Smoking Status: current  Current Medications (verified): 1)  Omeprazole 20 Mg Cpdr (Omeprazole) .Marland Kitchen.. 1 By Mouth Qd 2)  Oxycodone Hcl 15 Mg  Tabs (Oxycodone Hcl) .Marland Kitchen.. 1 Qid As Needed Pain Fill On or After 01/22/2010 3)  Cialis 20 Mg Tabs (Tadalafil) .Marland Kitchen.. 1 Tablet Every Other Day As Needed 4)  Vitamin D3 1000 Unit  Tabs (Cholecalciferol) .Marland Kitchen.. 1 By Mouth Daily 5)  Xanax 0.5 Mg Tabs (Alprazolam) .Marland Kitchen.. 1 By Mouth Two Times A Day As Needed Anxiety 6)  Hydrochlorothiazide 12.5 Mg  Tabs (Hydrochlorothiazide) .... Take 1 Tab  By Mouth Every Morning  Allergies: 1)  ! Erythromycin (Erythromycin)  Past History:  Past Medical History: Last updated: 10/29/2009 OSA Diabetes mellitus, type II GERD Low back pain R eye ret. detachment 2009  Sinus polyps Obesity Hypertension Asthmatic bronchitis Anxiety/ panic disorder Depression  Social History: Last updated: 01/27/2010 Occupation: [employer] Married Former [employer]  Regular exercise-no  Review of Systems       The patient complains of dyspnea on exertion.  The patient denies fever, chest pain, abdominal pain, melena, hematochezia, severe indigestion/heartburn, hematuria, genital sores, and difficulty walking.    Physical Exam  General:  alert and overweight-appearing.   Mouth:  Erythematous throat mucosa and intranasal erythema.  Lungs:  B no ronchi Heart:  RRR Abdomen:  S/NT Msk:  LS NT Pulses:  R and L  carotid,radial,femoral,dorsalis pedis and posterior tibial pulses are full and equal bilaterally Neurologic:  grosslly non focal  Skin:  No rash, no ecchymoses, and no petechiae.   Psych:  Oriented X3, good eye contact, and not anxious appearing.     Impression & Recommendations:  Problem # 1:  LOW BACK PAIN (ICD-724.2) R flank, poss kidney stone vs other Assessment New  His updated medication list for this problem includes:    Oxycodone Hcl 15 Mg Tabs (Oxycodone hcl) .Marland Kitchen... 1 qid as needed pain fill on or after 01/22/2010    Ketorolac Tromethamine 10 Mg Tabs (Ketorolac tromethamine) .Marland Kitchen... 1 by mouth two times a day-tid as needed renal colic The office visit took longer than 20 min with patient councelling for more than 50% of the 20 min   Last abd [place] reviewed - no stones Call if you are not better in a reasonable amount of time or if worse. Go to ER if feeling really bad!   Complete Medication List: 1)  Omeprazole 20 Mg Cpdr (Omeprazole) .Marland Kitchen.. 1 by mouth qd 2)  Oxycodone Hcl 15 Mg Tabs (Oxycodone hcl) .Marland Kitchen.. 1 qid as needed pain fill on or after 01/22/2010 3)  Cialis 20 Mg Tabs (Tadalafil) .Marland Kitchen.. 1 tablet every other day as needed 4)  Vitamin D3 1000 Unit Tabs (Cholecalciferol) .Marland Kitchen.. 1 by mouth daily 5)  Xanax 0.5 Mg Tabs (Alprazolam) .Marland Kitchen.. 1 by mouth two times a day as needed anxiety 6)  Hydrochlorothiazide 12.5  Mg Tabs (Hydrochlorothiazide) .... Take 1 tab  by mouth every morning 7)  Ketorolac Tromethamine 10 Mg Tabs (Ketorolac tromethamine) .Marland Kitchen.. 1 by mouth two times a day-tid as needed renal colic 8)  Ciprofloxacin Hcl 500 Mg Tabs (Ciprofloxacin hcl) .Marland Kitchen.. 1 by mouth bid  Other Orders: TLB-Udip ONLY (81003-UDIP)  Patient Instructions: 1)  Call if you are not better in a reasonable amount of time or if worse. Go to ER if feeling really bad!  2)  Please schedule a follow-up appointment in 1-2 weeks. Prescriptions: CIPROFLOXACIN HCL 500 MG TABS (CIPROFLOXACIN HCL) 1 by mouth bid  #20 x 1    Entered and Authorized by:   Tresa Garter MD   Signed by:   Tresa Garter MD on 01/31/2010   Method used:   Electronically to        [facility] [address] [phone]* (retail)       [address]       [place], [place]  [address]       Ph: [phone]       Fax: [phone]   RxID:   [id number] KETOROLAC TROMETHAMINE 10 MG TABS (KETOROLAC TROMETHAMINE) 1 by mouth two times a day-tid as needed renal colic  #30 x 0   Entered and Authorized by:   Tresa Garter MD   Signed by:   Tresa Garter MD on 01/31/2010   Method used:   Electronically to        [facility] [address] [phone]* (retail)       [address]       [place], [place]  [address]       Ph: [phone]       Fax: [phone]   RxID:   [id number] CIPROFLOXACIN HCL 500 MG TABS (CIPROFLOXACIN HCL) 1 by mouth bid  #20 x 1   Entered and Authorized by:   Tresa Garter MD   Signed by:   Tresa Garter MD on 01/31/2010   Method used:   Electronically to        [facility] [id number] (retail)       [address]       [place], [place]  [address]       Ph: [phone] or [phone]       Fax: [phone]   RxID:   [id number] KETOROLAC TROMETHAMINE 10 MG TABS (KETOROLAC TROMETHAMINE) 1 by mouth two times a day-tid as needed renal colic  #30 x 0   Entered and Authorized by:   Tresa Garter MD   Signed by:   Tresa Garter MD on 01/31/2010   Method used:   Electronically to        [facility]  [id number] (retail)       [address]       [place], [place]  [address]       Ph: [phone] or [phone]       Fax: [phone]   RxID:   [id number]

## 2010-12-14 NOTE — Assessment & Plan Note (Signed)
Summary: PAIN IN L BREAST/NWS   Vital Signs:  Patient profile:   60 year old male Height:      67 inches (170.18 cm) Weight:      306 pounds (139.09 kg) BMI:     48.10 O2 Sat:      97 % on Room air Temp:     97.7 degrees F (36.50 degrees C) oral Pulse rate:   81 / minute Pulse rhythm:   regular BP sitting:   142 / 88  (left arm) Cuff size:   large  Vitals Entered By: Brenton Grills CMA Duncan Dull) (October 20, 2010 8:26 AM)  O2 Flow:  Room air CC: Pt c/o pain, tenderness Left nipple x 2 weeks/aj Is Patient Diabetic? Yes Comments Pt is no longer taking Vimovo, Pennsaid, Flexeril, or Phentermine   Referring Provider:  Dr. Posey Rea Primary Provider:  Tresa Garter MD  CC:  Pt c/o pain and tenderness Left nipple x 2 weeks/aj.  History of Present Illness: pt states 2 weeks of slight pain at the left nipple area, but no assoc drainage.    he finished antibiotics for uri yesterday.    Current Medications (verified): 1)  Aldactazide 25-25 Mg Tabs (Spironolactone-Hctz) .Marland Kitchen.. 1 By Mouth Qam For Blood Pressure 2)  Oxycodone Hcl 15 Mg Tabs (Oxycodone Hcl) .Marland Kitchen.. 1 By Mouth Four Times A Day Fill On or After 09/19/2010 3)  Xanax 0.5 Mg Tabs (Alprazolam) .Marland Kitchen.. 1 By Mouth Two Times A Day As Needed Anxiety 4)  Cialis 20 Mg Tabs (Tadalafil) .Marland Kitchen.. 1 Tablet Every Other Day As Needed 5)  Vimovo 500-20 Mg Tbec (Naproxen-Esomeprazole) .Marland Kitchen.. 1 By Mouth Once Daily - Two Times A Day Pc As Needed Pain 6)  Pennsaid 1.5 % Soln (Diclofenac Sodium) .... 3-5 Gtt On Skin Three Times A Day For Pain 7)  Vitamin D3 1000 Unit  Tabs (Cholecalciferol) .Marland Kitchen.. 1 By Mouth Daily 8)  Flexeril 5 Mg Tabs (Cyclobenzaprine Hcl) .Marland Kitchen.. 1 By Mouth Three Times A Day As Needed 9)  Wellbutrin Xl 150 Mg Xr24h-Tab (Bupropion Hcl) .Marland Kitchen.. 1 By Mouth Once Daily - Generic 10)  Phentermine Hcl 37.5 Mg Caps (Phentermine Hcl) .Marland Kitchen.. 1 By Mouth Once Daily  Allergies (verified): 1)  ! Erythromycin (Erythromycin)  Past History:  Past Medical  History: Last updated: 07/13/2010 OSA      - PSG 05/24/10 AHI 90      - CPAP 13 cm H2O Diabetes mellitus, type II GERD Low back pain R eye ret. detachment 2009  Sinus polyps Obesity Hypertension Asthmatic bronchitis Anxiety/ panic disorder Depression  Family History: Reviewed history from 04/13/2010 and no changes required. Family History Hypertension No GS allergies-mother heart disease-father lymphoma-mother   Social History: Reviewed history from 04/13/2010 and no changes required. Occupation: [employer] Married Occasional THC use Regular exercise-no Never used tobacco products 3 children  Review of Systems  The patient denies headaches and fever.    Physical Exam  General:  morbidly obese.  no distress. Head:  head: no deformity eyes: no periorbital swelling, no proptosis external nose and ears are normal mouth: no lesion seen Neck:  Supple without thyroid enlargement or tenderness.  Breasts:  there is a 1 cm area of tenderness and fullness under the left areola.  the right breast area is normal Lungs:  Clear to auscultation bilaterally. Normal respiratory effort.  Heart:  Regular rate and rhythm without murmurs or gallops noted. Normal S1,S2.   Abdomen:  abdomen is soft, nontender.  no  hepatosplenomegaly.   not distended.  no hernia  Genitalia:  Normal external male genitalia with no urethral discharge.  Msk:  muscle bulk and strength are grossly normal.  no obvious joint swelling.  gait is normal and steady  Extremities:  no edema no deformity Neurologic:  cn 2-12 grossly intact.   readily moves all 4's.   sensation is intact to touch on all 4's Skin:  normal texture and temp.  no rash.  not diaphoretic  Cervical Nodes:  No significant adenopathy.  Psych:  Alert and cooperative; normal mood and affect; normal attention span and concentration.   Additional Exam:  Testosterone         [L]  193.77 ng/dL     FSH                       7.7 mIU/mL                   1.4-18.1 LH                        6.2 mIU/mL      Prolactin                 4.5 ng/mL    Estradiol                 19.9 pg/mL hCG, Quantitative         <2.0 mIU/mL    Impression & Recommendations:  Problem # 1:  BREAST MASS, LEFT (ICD-611.72) most likely is benign gynecomastia, but we need to be sure  Problem # 2:  HYPOGONADISM (ICD-257.2) Assessment: New uncertain etiology could cuase #1  Other Orders: Radiology Referral (Radiology) T- * Misc. Laboratory test [phone]) T- * Misc. Laboratory test [phone]) Surgical Referral (Surgery) TLB-Testosterone, Total (84403-TESTO) T-Prolactin [phone]) TLB-Luteinizing Hormone (LH) (83002-LH) TLB-FSH (Follicle Stimulating Hormone) (83001-FSH) Consultation Level III (30865)  Patient Instructions: 1)  check ultrasound of the left breast.  you will be called with a day and time for an appointment 2)  blood tests are being ordered for you today.  please call [phone] to hear your test results. 3)  refer surgery.  you will be called with a day and time for an appointment. 4)  (update: i left message on phone-tree:  rx as we discussed.  if surg w/u is neg, ret here after, so we can address hypogonadism)   Orders Added: 1)  Radiology Referral [Radiology] 2)  T- * Misc. Laboratory test [99999] 3)  T- * Misc. Laboratory test [99999] 4)  Surgical Referral [Surgery] 5)  TLB-Testosterone, Total [84403-TESTO] 6)  T-Prolactin [95284-13244] 7)  TLB-Luteinizing Hormone (LH) [83002-LH] 8)  TLB-FSH (Follicle Stimulating Hormone) [83001-FSH] 9)  Consultation Level III [01027]

## 2010-12-14 NOTE — Progress Notes (Signed)
Summary: REFILL - PLot patient  Phone Note Refill Request Call back at [phone]   Refills Requested: Medication #1:  Oxycodone 15mg  1 qid Initial call taken by: Lamar Sprinkles, CMA,  February 20, 2010 4:39 PM  Follow-up for Phone Call        ok - signed and given to triage a Follow-up by: Newt Lukes MD,  February 20, 2010 4:45 PM  Additional Follow-up for Phone Call Additional follow up Details #1::        Pt informed, rx ready for pick up Additional Follow-up by: Lamar Sprinkles, CMA,  February 20, 2010 6:17 PM    New/Updated Medications: OXYCODONE HCL 15 MG TABS (OXYCODONE HCL) 1 by mouth four times a day Prescriptions: OXYCODONE HCL 15 MG TABS (OXYCODONE HCL) 1 by mouth four times a day  #120 x 0   Entered and Authorized by:   Newt Lukes MD   Signed by:   Newt Lukes MD on 02/20/2010   Method used:   Print then Give to Patient   RxID:   [id number]

## 2010-12-14 NOTE — Consult Note (Signed)
Summary: [facility]  [facility]   Imported By: Sherian Rein 08/15/2010 14:01:22  _____________________________________________________________________  External Attachment:    Type:   Image     Comment:   External Document

## 2010-12-14 NOTE — Progress Notes (Signed)
Summary: REFILL - OXYCODONE  Phone Note Refill Request Call back at Home Phone [phone]   Refills Requested: Medication #1:  OXYCODONE HCL 15 MG TABS 1 by mouth four times a day Fill on or after 07/20/2010 Initial call taken by: Lamar Sprinkles, CMA,  August 18, 2010 2:53 PM  Follow-up for Phone Call        ok to ref Thank you!  Follow-up by: Tresa Garter MD,  August 18, 2010 4:57 PM

## 2010-12-14 NOTE — Progress Notes (Signed)
Summary: Oxycodone/Plot pt  Phone Note Call from Patient Call back at [facility] Phone [phone]   Summary of Call: Oxycodone refill, please call when ready for pick up. Initial call taken by: Lucious Groves,  May 22, 2010 8:47 AM  Follow-up for Phone Call        ok for 30 day refill Follow-up by: Jacques Navy MD,  May 22, 2010 1:41 PM  Additional Follow-up for Phone Call Additional follow up Details #1::        Rx shredded, pt should have gotten rx for #120. Pt was seen in the office by Dr Jonny Ruiz and given rx for mth supply.  Additional Follow-up by: Lamar Sprinkles, CMA,  May 22, 2010 5:48 PM    Prescriptions: OXYCODONE HCL 15 MG TABS (OXYCODONE HCL) 1 by mouth four times a day  #30 x 0   Entered by:   Lucious Groves   Authorized by:   Jacques Navy MD   Signed by:   Lucious Groves on 05/22/2010   Method used:   Print then Give to Patient   RxID:   [id number]

## 2010-12-14 NOTE — Assessment & Plan Note (Signed)
Summary: follow up osa/cb   Copy to:  Dr. Posey Rea Primary Provider/Referring Provider:  Tresa Garter MD  CC:  Sleep Consult.  Marland Kitchen  History of Present Illness: 60 yo male with sleep problems.  He had a sleep test years ago, and was told he had sleep apnea.  He was started on CPAP.  This helped at first, but then he started having problems with the machine.  He can not recall what kind of problem he had.  As a result he stopped using his CPAP.  Around that time he had lost about 100 lbs, and felt like he was sleeping better.  He has since regained about 80lbs, and with the increase in his weight his sleep problems have recurred.  He goes to bed at 10pm.  He sometimes will need something to help him fall asleep.  He was using high doses melatonin, but found he was having a hangover effect from this.  He wakes up once or twice to use the bathroom, and will fall back to sleep.  He gets out of bed at 6am.  He feels tired during the day, and was using nuvigil before, but stopped this after he stopped using melatonin.  He does snore, and has been told he stops breathing while asleep.  He has trouble sleeping on his back.  He gets wild, vivid dreams.  He does not act out his dreams while asleep.  He does kick alot in bed, but denies symptoms of restless legs.  He is using oxycodone two times a day for back pain.  He had sinus surgery, but this did not change is breathing while asleep.  He did have his tonsills taken out as a child.  He denies sleep walking, sleep talking, or bruxism.  There is no history of sleep hallucinations, sleep paralysis, or cataplexy.  He denies any thyroid problems.  He is planning on starting an exercise and weight loss regimen.  His Epworth score is 10 out of 24.  Current Medications (verified): 1)  Oxycodone Hcl 15 Mg Tabs (Oxycodone Hcl) .Marland Kitchen.. 1 By Mouth Four Times A Day 2)  Xanax 0.5 Mg Tabs (Alprazolam) .Marland Kitchen.. 1 By Mouth Two Times A Day As Needed Anxiety 3)   Ambien 10 Mg Tabs (Zolpidem Tartrate) .Marland Kitchen.. 1 At Bedtime As Needed 4)  Cialis 20 Mg Tabs (Tadalafil) .Marland Kitchen.. 1 Tablet Every Other Day As Needed 5)  Vimovo 500-20 Mg Tbec (Naproxen-Esomeprazole) .Marland Kitchen.. 1 By Mouth Once Daily - Two Times A Day Pc As Needed Pain 6)  Pennsaid 1.5 % Soln (Diclofenac Sodium) .... 3-5 Gtt On Skin Three Times A Day For Pain 7)  Melatonin 5 Mg Tabs (Melatonin) .Marland Kitchen.. 1 At Bedtime For Sleep 8)  Vitamin D3 1000 Unit  Tabs (Cholecalciferol) .Marland Kitchen.. 1 By Mouth Daily 9)  Nuvigil 150 Mg Tabs (Armodafinil) .Marland Kitchen.. 1 By Mouth Qam For Being More Alert 10)  Aldactazide 25-25 Mg Tabs (Spironolactone-Hctz) .Marland Kitchen.. 1 By Mouth Qam For Blood Pressure  Allergies (verified): 1)  ! Erythromycin (Erythromycin)  Past History:  Past Medical History: Reviewed history from 03/31/2010 and no changes required. OSA (Stopped CPAP 2009) Diabetes mellitus, type II GERD Low back pain R eye ret. detachment 2009  Sinus polyps Obesity Hypertension Asthmatic bronchitis Anxiety/ panic disorder Depression  Past Surgical History: CTS L Urethrotomies Sinus polyps  Tonsillectomy  Family History: Reviewed history from 10/29/2009 and no changes required. Family History Hypertension No GS allergies-mother heart disease-father lymphoma-mother   Social History: Reviewed history from  01/27/2010 and no changes required. Occupation: [employer] Married Occasional THC use Regular exercise-no Never used tobacco products 3 children  Review of Systems       The patient complains of acid heartburn, weight change, nasal congestion/difficulty breathing through nose, and anxiety.  The patient denies shortness of breath with activity, shortness of breath at rest, productive cough, non-productive cough, coughing up blood, chest pain, irregular heartbeats, indigestion, loss of appetite, abdominal pain, difficulty swallowing, sore throat, tooth/dental problems, headaches, sneezing, itching, ear ache, depression,  hand/feet swelling, joint stiffness or pain, rash, change in color of mucus, and fever.    Vital Signs:  Patient profile:   60 year old male Height:      67 inches Weight:      298 pounds BMI:     46.84 O2 Sat:      97 % on Room air Temp:     98.0 degrees F oral Pulse rate:   78 / minute BP sitting:   142 / 86  (left arm) Cuff size:   large  Vitals Entered By: Gweneth Dimitri RN (April 13, 2010 10:47 AM)  O2 Flow:  Room air CC: Sleep Consult.   Comments Medications reviewed with patient Daytime contact number verified with patient. Gweneth Dimitri RN  April 13, 2010 10:48 AM    Physical Exam  General:  obese.   Eyes:  PERRLA and EOMI, wears glasses Nose:  no deformity, discharge, inflammation, or lesions Mouth:  MP 4, enlarged tongue, elongated uvula Neck:  no JVD.   Chest Wall:  no deformities noted Lungs:  clear bilaterally to auscultation and percussion Heart:  regular rate and rhythm, S1, S2 without murmurs, rubs, gallops, or clicks Abdomen:  obese, soft, non-tender Msk:  normal gait.   Pulses:  pulses normal Extremities:  1+ edema Neurologic:  CN II-XII grossly intact with normal reflexes, coordination, muscle strength and tone Cervical Nodes:  no significant adenopathy Psych:  alert and cooperative; normal mood and affect; normal attention span and concentration   Impression & Recommendations:  Problem # 1:  OBSTRUCTIVE SLEEP APNEA (ICD-327.23) He had sleep test years ago, and was told he had sleep apnea.  He had initial improvement with CPAP, but then had difficulty with CPAP.  He has not used CPAP for years.  He has also gained a significant amount of weight since his original sleep test.  He has symptoms of sleep disruption, and daytime sleepiness.  He also had a history of cardiovascular disease.  To further assess this I will arrange for a sleep test.  I have explained how sleep apnea can affect his health.  Driving precautions were reviewed.  I explained how his  weight is affecting his sleep and his health.  I suspect that some of his problems with falling asleep and staying asleep may be related to his sleep apnea rather than insomnia.  Will re-address whether he needs to use sleep aides after his sleep apnea is further addressed.    Also will determine if he needs to have continued use of stimulant medications during the day after his sleep apnea is controlled.  Problem # 2:  ANXIETY (ICD-300.00) He is on xanax as needed per primary care.  Complete Medication List: 1)  Aldactazide 25-25 Mg Tabs (Spironolactone-hctz) .Marland Kitchen.. 1 by mouth qam for blood pressure 2)  Oxycodone Hcl 15 Mg Tabs (Oxycodone hcl) .Marland Kitchen.. 1 by mouth four times a day 3)  Xanax 0.5 Mg Tabs (Alprazolam) .Marland KitchenMarland KitchenMarland Kitchen 1  by mouth two times a day as needed anxiety 4)  Ambien 10 Mg Tabs (Zolpidem tartrate) .Marland Kitchen.. 1 at bedtime as needed 5)  Melatonin 5 Mg Tabs (Melatonin) .Marland Kitchen.. 1 at bedtime for sleep 6)  Nuvigil 150 Mg Tabs (Armodafinil) .Marland Kitchen.. 1 by mouth qam for being more alert 7)  Cialis 20 Mg Tabs (Tadalafil) .Marland Kitchen.. 1 tablet every other day as needed 8)  Vimovo 500-20 Mg Tbec (Naproxen-esomeprazole) .Marland Kitchen.. 1 by mouth once daily - two times a day pc as needed pain 9)  Pennsaid 1.5 % Soln (Diclofenac sodium) .... 3-5 gtt on skin three times a day for pain 10)  Vitamin D3 1000 Unit Tabs (Cholecalciferol) .Marland Kitchen.. 1 by mouth daily  Other Orders: Consultation Level IV (04540) Sleep Disorder Referral (Sleep Disorder)  Patient Instructions: 1)  Will schedule sleep test 2)  Will call to schedule follow up after sleep test reviewed

## 2010-12-14 NOTE — Progress Notes (Signed)
Summary: REFILL - OXYCODONE  Phone Note Refill Request   Refills Requested: Medication #1:  OXYCODONE HCL 15 MG TABS 1 by mouth four times a day Initial call taken by: Lamar Sprinkles, CMA,  April 19, 2010 2:42 PM  Follow-up for Phone Call        ok to ref if time Follow-up by: Tresa Garter MD,  April 19, 2010 5:46 PM  Additional Follow-up for Phone Call Additional follow up Details #1::        Pt informed rx will be ready this pm  Additional Follow-up by: Lamar Sprinkles, CMA,  April 21, 2010 8:50 AM    Prescriptions: OXYCODONE HCL 15 MG TABS (OXYCODONE HCL) 1 by mouth four times a day  #120 x 0   Entered by:   Lucious Groves   Authorized by:   Tresa Garter MD   Signed by:   Lucious Groves on 04/20/2010   Method used:   Print then Give to Patient   RxID:   [id number]

## 2010-12-14 NOTE — Assessment & Plan Note (Signed)
Summary: left arm pain for 3 wks/#/cd   Vital Signs:  Patient profile:   60 year old male Height:      67 inches (170.18 cm) Weight:      292 pounds (132.73 kg) BMI:     45.90 O2 Sat:      97 % on Room air Temp:     98.5 degrees F (36.94 degrees C) oral Pulse rate:   64 / minute Pulse rhythm:   regular BP sitting:   122 / 80  (left arm) Cuff size:   large  Vitals Entered By: Brenton Grills (Mar 14, 2010 8:55 AM)  O2 Flow:  Room air CC: pt c/o left arm pain x 3 weeks/pt states pain is 7/10 at it's worse/pain is constant/pt also states he never took ketorolac/aj   CC:  pt c/o left arm pain x 3 weeks/pt states pain is 7/10 at it's worse/pain is constant/pt also states he never took ketorolac/aj.  History of Present Illness: C/o dull  L forearm pain off and on x 1 wk. No injury. F/u LBP, obesity  Current Medications (verified): 1)  Omeprazole 20 Mg Cpdr (Omeprazole) .Marland Kitchen.. 1 By Mouth Qd 2)  Cialis 20 Mg Tabs (Tadalafil) .Marland Kitchen.. 1 Tablet Every Other Day As Needed 3)  Vitamin D3 1000 Unit  Tabs (Cholecalciferol) .Marland Kitchen.. 1 By Mouth Daily 4)  Xanax 0.5 Mg Tabs (Alprazolam) .Marland Kitchen.. 1 By Mouth Two Times A Day As Needed Anxiety 5)  Hydrochlorothiazide 12.5 Mg  Tabs (Hydrochlorothiazide) .... Take 1 Tab  By Mouth Every Morning 6)  Ketorolac Tromethamine 10 Mg Tabs (Ketorolac Tromethamine) .Marland Kitchen.. 1 By Mouth Two Times A Day-Tid As Needed Renal Colic 7)  Oxycodone Hcl 15 Mg Tabs (Oxycodone Hcl) .Marland Kitchen.. 1 By Mouth Four Times A Day  Allergies (verified): 1)  ! Erythromycin (Erythromycin)  Past History:  Past Medical History: Last updated: 10/29/2009 OSA Diabetes mellitus, type II GERD Low back pain R eye ret. 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     Mouth:  Erythematous throat mucosa and intranasal erythema.  Lungs:  B no ronchi Heart:  RRR Msk:  L lat forearm is tender in 12x6 cm area Lumbar-sacral spine is tender to palpation over paraspinal muscles and painfull with the ROM  Pulses:  R and L carotid,radial,femoral,dorsalis pedis and posterior tibial pulses are full and equal bilaterally Extremities:  No edematrace left pedal edema and trace right pedal edema.   Neurologic:  grosslly non focal  Skin:  No rash, no ecchymoses, and no petechiae.   Psych:  Oriented X3, good eye contact, and not anxious appearing.     Impression & Recommendations:  Problem # 1:  ARM PAIN (ICD-729.5) R forearm - MSK strain Assessment New Vimovo Pennsaid We can try to inj trigger points if not better  Problem # 2:  BACK PAIN (ICD-724.5) Assessment: Unchanged  His updated medication list for this problem includes:    Ketorolac Tromethamine 10 Mg Tabs (Ketorolac tromethamine) .Marland Kitchen... 1 by mouth two times a day-tid as needed renal colic    Oxycodone Hcl 15 Mg Tabs (Oxycodone hcl) .Marland Kitchen... 1 by mouth four times a day    Vimovo 500-20 Mg Tbec (Naproxen-esomeprazole) .Marland Kitchen... 1 by mouth once daily - two times a day pc as needed pain  Problem # 3:  OBESITY, MORBID (ICD-278.01) Assessment: Improved Lost wt  Complete Medication List: 1)  Omeprazole 20 Mg Cpdr (Omeprazole) .Marland Kitchen.. 1 by mouth qd 2)  Cialis 20 Mg Tabs (Tadalafil) .Marland Kitchen.. 1 tablet every other day as needed 3)  Vitamin D3 1000 Unit Tabs (Cholecalciferol) .Marland Kitchen.. 1 by mouth daily 4)  Xanax 0.5 Mg Tabs (Alprazolam) .Marland Kitchen.. 1 by mouth two times a day as needed anxiety 5)  Hydrochlorothiazide 12.5 Mg Tabs (Hydrochlorothiazide) .... Take 1 tab  by mouth every morning 6)  Ketorolac Tromethamine 10 Mg Tabs (Ketorolac tromethamine) .Marland Kitchen.. 1 by mouth two times a day-tid as needed renal colic 7)  Oxycodone Hcl 15 Mg Tabs (Oxycodone hcl) .Marland Kitchen.. 1 by mouth four times a day 8)  Vimovo 500-20 Mg Tbec (Naproxen-esomeprazole)  .Marland Kitchen.. 1 by mouth once daily - two times a day pc as needed pain 9)  Pennsaid 1.5 % Soln (Diclofenac sodium) .... 3-5 gtt on skin three times a day for pain  Patient Instructions: 1)  Call if you are not better in a reasonable amount of time or if worse.  2)  Please schedule a follow-up appointment in 2 months. 3)  Ice or heat to L arm Prescriptions: OXYCODONE HCL 15 MG TABS (OXYCODONE HCL) 1 by mouth four times a day  #120 x 0   Entered and Authorized by:   Tresa Garter MD   Signed by:   Tresa Garter MD on 03/14/2010   Method used:   Print then Give to Patient   RxID:   [id number] PENNSAID 1.5 % SOLN (DICLOFENAC SODIUM) 3-5 gtt on skin three times a day for pain  #1 x 3   Entered and Authorized by:   Tresa Garter MD   Signed by:   Tresa Garter MD on 03/14/2010   Method used:   Print then Give to Patient   RxID:   [id number] VIMOVO 500-20 MG TBEC (NAPROXEN-ESOMEPRAZOLE) 1 by mouth once daily - two times a day pc as needed pain  #60 x 3   Entered and Authorized by:   Tresa Garter MD   Signed by:   Tresa Garter MD on 03/14/2010   Method used:   Print then Give to Patient   RxID:   [id number]

## 2010-12-14 NOTE — Assessment & Plan Note (Signed)
Summary: 6-8 week followup on cpap/alr   Visit Type:  Follow-up Copy to:  Dr. Posey Rea Primary Provider/Referring Provider:  Tresa Garter MD  CC:  OSA.  CPAP follow-up.  Patient says he is doing well on CPAP...mask fits well...sleeps approx 5-6 hours every night...less tired during the day...not using the Ambien or Nuvigil or Melatonin.Marland Kitchen  History of Present Illness: 60 yo male with severe OSA on CPAP 13 cm H2O.  He has started CPAP.  He has a hybrid mask.  He goes to bed at 10pm.  He falls asleep after about 30 min.  He sleeps for about 7 hours, then wakes up to use the bathroom.  He will go back to bed for another two hours.  He is not using his CPAP then.  He gets out of bed at 6am.  He feels good in the morning.  He is no longer using ambien, melatonin, or nuvigil.  He is planning on joining a gym after labor day.  Preventive Screening-Counseling & Management  Alcohol-Tobacco     Smoking Status: current  Caffeine-Diet-Exercise     Does Patient Exercise: no  Current Medications (verified): 1)  Aldactazide 25-25 Mg Tabs (Spironolactone-Hctz) .Marland Kitchen.. 1 By Mouth Qam For Blood Pressure 2)  Oxycodone Hcl 15 Mg Tabs (Oxycodone Hcl) .Marland Kitchen.. 1 By Mouth Four Times A Day 3)  Xanax 0.5 Mg Tabs (Alprazolam) .Marland Kitchen.. 1 By Mouth Two Times A Day As Needed Anxiety 4)  Cialis 20 Mg Tabs (Tadalafil) .Marland Kitchen.. 1 Tablet Every Other Day As Needed 5)  Vimovo 500-20 Mg Tbec (Naproxen-Esomeprazole) .Marland Kitchen.. 1 By Mouth Once Daily - Two Times A Day Pc As Needed Pain 6)  Pennsaid 1.5 % Soln (Diclofenac Sodium) .... 3-5 Gtt On Skin Three Times A Day For Pain 7)  Vitamin D3 1000 Unit  Tabs (Cholecalciferol) .Marland Kitchen.. 1 By Mouth Daily 8)  Flexeril 5 Mg Tabs (Cyclobenzaprine Hcl) .Marland Kitchen.. 1 By Mouth Three Times A Day As Needed 9)  Wellbutrin Xl 150 Mg Xr24h-Tab (Bupropion Hcl) .Marland Kitchen.. 1 By Mouth Once Daily - Generic 10)  Phentermine Hcl 37.5 Mg Caps (Phentermine Hcl) .Marland Kitchen.. 1 By Mouth Once Daily  Allergies (verified): 1)  !  Erythromycin (Erythromycin)  Past History:  Past Medical History: OSA      - PSG 05/24/10 AHI 90      - CPAP 13 cm H2O Diabetes mellitus, type II GERD Low back pain R eye ret. detachment 2009  Sinus polyps Obesity Hypertension Asthmatic bronchitis Anxiety/ panic disorder Depression  Past Surgical History: Reviewed history from 04/13/2010 and no changes required. CTS L Urethrotomies Sinus polyps  Tonsillectomy  Vital Signs:  Patient profile:   59 year old male Height:      67 inches (170.18 cm) Weight:      294.50 pounds (133.86 kg) BMI:     46.29 O2 Sat:      97 % on Room air Temp:     98.1 degrees F (36.72 degrees C) oral Pulse rate:   78 / minute BP sitting:   134 / 82  (left arm) Cuff size:   large  Vitals Entered By: Michel Bickers CMA (July 13, 2010 8:46 AM)  O2 Sat at Rest %:  97 O2 Flow:  Room air CC: OSA.  CPAP follow-up.  Patient says he is doing well on CPAP...mask fits well...sleeps approx 5-6 hours every night...less tired during the day...not using the Ambien or Nuvigil or Melatonin. Comments Medications reviewed with the patient. Daytime phone verified. Lawson Fiscal  Excell Selt[facility]  July 13, 2010 8:55 AM   Physical Exam  General:  obese.   Nose:  no deformity, discharge, inflammation, or lesions Mouth:  MP 4, enlarged tongue, elongated uvula Neck:  no JVD.   Lungs:  clear bilaterally to auscultation and percussion Heart:  regular rate and rhythm, S1, S2 without murmurs, rubs, gallops, or clicks Extremities:  1+ edema Cervical Nodes:  no significant adenopathy   Impression & Recommendations:  Problem # 1:  OBSTRUCTIVE SLEEP APNEA (ICD-327.23)  He has severe OSA.  He has done well with CPAP.  I have encouraged him to start a weight loss program.  He is no longer using sleep aides, or stimulants.  I have advised him to try using his mask for the entire time he is asleep.  Complete Medication List: 1)  Aldactazide 25-25 Mg Tabs  (Spironolactone-hctz) .Marland Kitchen.. 1 by mouth qam for blood pressure 2)  Oxycodone Hcl 15 Mg Tabs (Oxycodone hcl) .Marland Kitchen.. 1 by mouth four times a day 3)  Xanax 0.5 Mg Tabs (Alprazolam) .Marland Kitchen.. 1 by mouth two times a day as needed anxiety 4)  Cialis 20 Mg Tabs (Tadalafil) .Marland Kitchen.. 1 tablet every other day as needed 5)  Vimovo 500-20 Mg Tbec (Naproxen-esomeprazole) .Marland Kitchen.. 1 by mouth once daily - two times a day pc as needed pain 6)  Pennsaid 1.5 % Soln (Diclofenac sodium) .... 3-5 gtt on skin three times a day for pain 7)  Vitamin D3 1000 Unit Tabs (Cholecalciferol) .Marland Kitchen.. 1 by mouth daily 8)  Flexeril 5 Mg Tabs (Cyclobenzaprine hcl) .Marland Kitchen.. 1 by mouth three times a day as needed 9)  Wellbutrin Xl 150 Mg Xr24h-tab (Bupropion hcl) .Marland Kitchen.. 1 by mouth once daily - generic 10)  Phentermine Hcl 37.5 Mg Caps (Phentermine hcl) .Marland Kitchen.. 1 by mouth once daily  Other Orders: Est. Patient Level III (45409)  Patient Instructions: 1)  Follow up in 6 months

## 2010-12-14 NOTE — Progress Notes (Signed)
Summary: REFILL - PAIN MEDS  Phone Note Refill Request   Refills Requested: Medication #1:  OXYCODONE HCL 15 MG TABS 1 by mouth four times a day Fill on or after 07/20/2010 Initial call taken by: Lamar Sprinkles, CMA,  September 15, 2010 12:37 PM  Follow-up for Phone Call        ok to ref if time Follow-up by: Tresa Garter MD,  September 15, 2010 1:24 PM  Additional Follow-up for Phone Call Additional follow up Details #1::        RX placed upfront.lmovm to pick up.Marland KitchenMarland KitchenAlvy Beal Archie CMA  September 15, 2010 4:54 PM   Patient notified that rx ready.Alvy Beal Archie CMA  September 18, 2010 8:58 AM     New/Updated Medications: OXYCODONE HCL 15 MG TABS (OXYCODONE HCL) 1 by mouth four times a day Fill on or after 09/19/2010 Prescriptions: OXYCODONE HCL 15 MG TABS (OXYCODONE HCL) 1 by mouth four times a day Fill on or after 09/19/2010  #120 x 0   Entered by:   Lamar Sprinkles, CMA   Authorized by:   Tresa Garter MD   Signed by:   Lamar Sprinkles, CMA on 09/15/2010   Method used:   Print then Give to Patient   RxID:   [id number]

## 2010-12-14 NOTE — Progress Notes (Signed)
Summary: Alprazolam Rf-Plot pt  Phone Note Refill Request Message from:  Pharmacy  Refills Requested: Medication #1:  XANAX 0.5 MG TABS 1 by mouth two times a day as needed anxiety   Dosage confirmed as above?Dosage Confirmed   Supply Requested: 60   Last Refilled: 07/26/2010  Method Requested: Telephone to Pharmacy Initial call taken by: Lanier Prude, [facility]),  August 31, 2010 5:17 PM  Follow-up for Phone Call        ok for refill x 5 under Dr. Georgeanna Harrison name Follow-up by: Jacques Navy MD,  August 31, 2010 6:48 PM  Additional Follow-up for Phone Call Additional follow up Details #1::        Rx called to pharmacy Additional Follow-up by: Lanier Prude, [facility]),  September 01, 2010 9:07 AM    Prescriptions: Prudy Feeler 0.5 MG TABS (ALPRAZOLAM) 1 by mouth two times a day as needed anxiety  #60 x 5   Entered by:   Lanier Prude, [facility]   Authorized by:   Tresa Garter MD   Signed by:   Lanier Prude, CMA(AAMA) on 09/01/2010   Method used:   Telephoned to ...       [facility]  [address] [phone]* (retail)       [address]       [place], [place]  [address]       Ph: [phone]       Fax: [phone]   RxID:   [id number]

## 2010-12-14 NOTE — Assessment & Plan Note (Signed)
Summary: SCRATCHY THROAT/CONGESTION /NWS   Vital Signs:  Patient profile:   60 year old male Weight:      282 pounds Temp:     98.1 degrees F oral Pulse rate:   88 / minute BP sitting:   140 / 90  (left arm)  Vitals Entered By: Tora Perches (November 16, 2009 4:05 PM) CC: scratchy throat Is Patient Diabetic? No   CC:  scratchy throat.  History of Present Illness: C/ofeeling that  smthg stuck in throat x 1 wk Better w/tea Lost Oxycod Rx for Jan  Preventive Screening-Counseling & Management  Alcohol-Tobacco     Smoking Status: current  Current Medications (verified): 1)  Omeprazole 20 Mg Cpdr (Omeprazole) .Marland Kitchen.. 1 By Mouth Qd 2)  Oxycodone Hcl 15 Mg  Tabs (Oxycodone Hcl) .Marland Kitchen.. 1 Qid As Needed Pain Fill On or After 11/24/2009 3)  Cialis 20 Mg Tabs (Tadalafil) .Marland Kitchen.. 1 Tablet Every Other Day As Needed 4)  Vitamin D3 1000 Unit  Tabs (Cholecalciferol) .Marland Kitchen.. 1 By Mouth Daily 5)  Ambien 10 Mg Tabs (Zolpidem Tartrate) .Marland Kitchen.. 1 By Mouth As Needed At Bedtime 6)  Melatonin 3 Mg Tabs (Melatonin) .... Once Daily 7)  Xanax 0.5 Mg Tabs (Alprazolam) .Marland Kitchen.. 1 By Mouth Two Times A Day As Needed Anxiety 8)  Wellbutrin Sr 150 Mg Xr12h-Tab (Bupropion Hcl) .Marland Kitchen.. 1po Bid 9)  Hydrochlorothiazide 12.5 Mg  Tabs (Hydrochlorothiazide) .... Take 1 Tab  By Mouth Every Morning 10)  Tessalon Perles 100 Mg Caps (Benzonatate) .Marland Kitchen.. 1-2 By Mouth Two Times A Day As Needed Cogh  Allergies: 1)  ! Erythromycin (Erythromycin)  Past History:  Past Medical History: Last updated: 10/29/2009 OSA Diabetes mellitus, type II GERD Low back pain R eye ret. 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    Review of Systems  The patient denies fever, weight loss, chest pain,  hemoptysis, and abdominal pain.    Physical Exam  General:  alert and overweight-appearing.   Mouth:  Erythematous throat mucosa and intranasal erythema.  Neck:  No deformities, masses, or tenderness noted. Lungs:  B no ronchi Heart:  RRR Abdomen:  S/NT Skin:   no ecchymoses, and no petechiae.     Impression & Recommendations:  Problem # 1:  DYSPHAGIA UNSPECIFIED (ICD-787.20) poss candida infection Assessment New Empiric Nystatin. GI  or ENT cons if not better: he will call  Problem # 2:  BACK PAIN (ICD-724.5) Assessment: Comment Only  His updated medication list for this problem includes:    Oxycodone Hcl 15 Mg Tabs (Oxycodone hcl) .Marland Kitchen... 1 qid as needed pain fill on or after 11/24/2009 (destroy old Rx if found)  Complete Medication List: 1)  Omeprazole 20 Mg Cpdr (Omeprazole) .Marland Kitchen.. 1 by mouth qd 2)  Oxycodone Hcl 15 Mg Tabs (Oxycodone hcl) .Marland Kitchen.. 1 qid as needed pain fill on or after 11/24/2009 3)  Cialis 20 Mg Tabs (Tadalafil) .Marland Kitchen.. 1 tablet every other day as needed 4)  Vitamin D3 1000 Unit Tabs (Cholecalciferol) .Marland Kitchen.. 1 by mouth daily 5)  Ambien 10 Mg Tabs (Zolpidem tartrate) .Marland Kitchen.. 1 by mouth as needed at bedtime 6)  Melatonin 3 Mg Tabs (Melatonin) .... Once daily 7)  Xanax 0.5 Mg Tabs (Alprazolam) .Marland Kitchen.. 1 by mouth two times a day as needed  anxiety 8)  Wellbutrin Sr 150 Mg Xr12h-tab (Bupropion hcl) .Marland Kitchen.. 1po bid 9)  Hydrochlorothiazide 12.5 Mg Tabs (Hydrochlorothiazide) .... Take 1 tab  by mouth every morning 10)  Tessalon Perles 100 Mg Caps (Benzonatate) .Marland Kitchen.. 1-2 by mouth two times a day as needed cogh 11)  Nystatin 100000 Unit/ml Susp (Nystatin) .... 5 ml by mouth swishh, gargle and swallow qid for thrush  Patient Instructions: 1)  Call if you are not better in a reasonable amount of time or if worse.  Prescriptions: OXYCODONE HCL 15 MG  TABS (OXYCODONE HCL) 1 qid as needed pain fill on or after 11/24/2009  #120 x 0   Entered and Authorized by:   Tresa Garter MD   Signed  by:   Tresa Garter MD on 11/16/2009   Method used:   Print then Give to Patient   RxID:   [id number] NYSTATIN 100000 UNIT/ML SUSP (NYSTATIN) 5 ml by mouth swishh, gargle and swallow qid for thrush  #300 ml x 1   Entered and Authorized by:   Tresa Garter MD   Signed by:   Tresa Garter MD on 11/16/2009   Method used:   Print then Give to Patient   RxID:   [id number]

## 2010-12-14 NOTE — Progress Notes (Signed)
Summary: REFILL  Phone Note Refill Request Call back at Home Phone [phone]   Refills Requested: Medication #1:  OXYCODONE HCL 15 MG TABS 1 by mouth four times a day Initial call taken by: Lamar Sprinkles, CMA,  June 19, 2010 11:11 AM  Follow-up for Phone Call        ok to ref Follow-up by: Tresa Garter MD,  June 20, 2010 1:26 PM  Additional Follow-up for Phone Call Additional follow up Details #1::        Patient picked up Additional Follow-up by: Lamar Sprinkles, CMA,  June 20, 2010 2:47 PM    Prescriptions: OXYCODONE HCL 15 MG TABS (OXYCODONE HCL) 1 by mouth four times a day  #120 x 0   Entered by:   Rock Nephew CMA   Authorized by:   Tresa Garter MD   Signed by:   Rock Nephew CMA on 06/20/2010   Method used:   Print then Give to Patient   RxID:   [id number]

## 2010-12-14 NOTE — Assessment & Plan Note (Signed)
Summary: PER PT ER FU---STC   Vital Signs:  Patient profile:   60 year old male Height:      67 inches Weight:      302 pounds BMI:     47.47 Temp:     98.9 degrees F oral Pulse rate:   80 / minute Pulse rhythm:   regular Resp:     16 per minute BP sitting:   148 / 88  (left arm) Cuff size:   large  Vitals Entered By: Lanier Prude, CMA(AAMA) (December 06, 2010 7:55 AM) CC: f/u  Is Patient Diabetic? Yes Comments pt was seen at [facility] ER Monday for chest pain, SOB, dizziness and blurred vision.    Primary Care Provider:  Tresa Garter MD  CC:  f/u .  History of Present Illness: The patient presents for a post-hospital visit for CP in [facility]. He had a GERD attack on Mon after eating meatloaf with sauce prior. ER w/up was negative. He gets these attacks 1-2/wk. Co-workers called ambulance. C/o wt gain. C/o memory loss.  Current Medications (verified): 1)  Aldactazide 25-25 Mg Tabs (Spironolactone-Hctz) .Marland Kitchen.. 1 By Mouth Qam For Blood Pressure 2)  Oxycodone Hcl 15 Mg Tabs (Oxycodone Hcl) .Marland Kitchen.. 1 By Mouth Four Times A Day Fill On or After 11/15/2010 3)  Xanax 0.5 Mg Tabs (Alprazolam) .Marland Kitchen.. 1 By Mouth Two Times A Day As Needed Anxiety 4)  Cialis 20 Mg Tabs (Tadalafil) .Marland Kitchen.. 1 Tablet Every Other Day As Needed 5)  Vitamin D3 1000 Unit  Tabs (Cholecalciferol) .Marland Kitchen.. 1 By Mouth Daily 6)  Wellbutrin Xl 150 Mg Xr24h-Tab (Bupropion Hcl) .Marland Kitchen.. 1 By Mouth Once Daily - Generic 7)  Clomiphene Citrate 50 Mg Tabs (Clomiphene Citrate) .... 1/4 Tab Once Daily 8)  Buffered Aspirin 325 Mg Tabs (Aspirin Buf(Cacarb-Mgcarb-Mgo)) .Marland Kitchen.. 1 By Mouth Once Daily  Allergies (verified): 1)  ! Erythromycin (Erythromycin)  Past History:  Past Medical History: Last updated: 07/13/2010 OSA      - PSG 05/24/10 AHI 90      - CPAP 13 cm H2O Diabetes mellitus, type II GERD Low back pain R eye ret. 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  The patient denies syncope, melena, and hematochezia.    Physical Exam  General:  alert and overweight-appearing.  , fatigue but non toxic Ears:  R ear normal and L ear normal.   Nose:  no external deformity and no nasal discharge.   Mouth:  no gingival abnormalities and pharynx pink and moist.   Neck:  supple and no masses.   Lungs:  normal respiratory effort and normal breath sounds.   Heart:  normal rate and regular rhythm.   Abdomen:  soft, non-tender, and normal bowel sounds.   Msk:  no spine tender except for chronic upper lumbar midline tender without paravertebral tender , swelling or eryhema or drainage. Neurologic:  strength normal in all extremities and gait normal.   Skin:  No rash, no ecchymoses, and no petechiae.   Psych:  Oriented X3, good eye contact, and not anxious appearing.     Impression & Recommendations:  Problem # 1:  CHEST PAIN, UNSPECIFIED (ICD-786.50) due to  GERD Assessment New He declined stress test, card appt. Risks discussed. We will get records from [facility].  Problem # 2:  GERD (ICD-530.81) Assessment: Deteriorated  His updated medication list for this problem includes:    Omeprazole 40 Mg Cpdr (Omeprazole) .Marland Kitchen... 1 by mouth bid for indigestion    Ranitidine Hcl 300 Mg Tabs (Ranitidine hcl) .Marland Kitchen... 1 by mouth once daily for indigestion  Problem # 3:  MEMORY LOSS (ICD-780.93) multifactorial Assessment: Deteriorated Treat #4 Cut back on Oxycodone, use CPAP  Problem # 4:  OBSTRUCTIVE SLEEP APNEA (ICD-327.23) Assessment: Deteriorated Risks of noncompliance with treatment incl death discussed. Compliance encouraged.   Problem # 5:  OBESITY,  MORBID (ICD-278.01) Assessment: Deteriorated Lap band adviced  Complete Medication List: 1)  Aldactazide 25-25 Mg Tabs (Spironolactone-hctz) .Marland Kitchen.. 1 by mouth qam for blood pressure 2)  Oxycodone Hcl 15 Mg Tabs (Oxycodone hcl) .Marland Kitchen.. 1 by mouth four times a day fill on or after 12/16/2010 3)  Xanax 0.5 Mg Tabs (Alprazolam) .Marland Kitchen.. 1 by mouth two times a day as needed anxiety 4)  Cialis 20 Mg Tabs (Tadalafil) .Marland Kitchen.. 1 tablet every other day as needed 5)  Vitamin D3 1000 Unit Tabs (Cholecalciferol) .Marland Kitchen.. 1 by mouth daily 6)  Wellbutrin Xl 150 Mg Xr24h-tab (Bupropion hcl) .Marland Kitchen.. 1 by mouth once daily - generic 7)  Clomiphene Citrate 50 Mg Tabs (Clomiphene citrate) .... 1/4 tab once daily 8)  Buffered Aspirin 325 Mg Tabs (Aspirin buf(cacarb-mgcarb-mgo)) .Marland Kitchen.. 1 by mouth once daily 9)  Omeprazole 40 Mg Cpdr (Omeprazole) .Marland Kitchen.. 1 by mouth bid for indigestion 10)  Ranitidine Hcl 300 Mg Tabs (Ranitidine hcl) .Marland Kitchen.. 1 by mouth once daily for indigestion 11)  Aspirin 81 Mg Tbec (Aspirin) .Marland Kitchen.. 1 by mouth qd  Patient Instructions: 1)  Please schedule a follow-up appointment in 3 months. 2)  BMP prior to visit, ICD-9: 3)  Hepatic Panel prior to visit, ICD-9: 4)  Lipid Panel prior to visit, ICD-9:786.50  278.01 5)  TSH prior to visit, ICD-9: 6)  CBC w/ Diff prior to visit, ICD-9: 7)  HbgA1C prior to visit, ICD-9: 8)  Vit B12 782.0 Prescriptions: OXYCODONE HCL 15 MG TABS (OXYCODONE HCL) 1 by mouth four times a day Fill on or after 12/16/2010  #120 x 0   Entered and Authorized by:   Tresa Garter MD   Signed by:   Tresa Garter MD on 12/06/2010   Method used:   Print then Give to Patient   RxID:   [id number] RANITIDINE HCL 300 MG TABS (RANITIDINE HCL) 1 by mouth once daily for indigestion  #90 x 3   Entered and Authorized by:   Tresa Garter MD   Signed by:   Tresa Garter MD on 12/06/2010   Method used:   Electronically to        [facility] [address] [phone]* (retail)       [address]       [place], [place]  [address]       Ph: [phone]       Fax: [phone]   RxID:   [id number]    Orders Added: 1)  Est. Patient Level V [10932]

## 2010-12-14 NOTE — Assessment & Plan Note (Signed)
Summary: FU---STC   Vital Signs:  Patient profile:   60 year old male Height:      67 inches (170.18 cm) Weight:      297.50 pounds (135.23 kg) BMI:     46.76 O2 Sat:      96 % on Room air Temp:     98.3 degrees F (36.83 degrees C) oral Pulse rate:   67 / minute Pulse rhythm:   regular BP sitting:   132 / 74  (left arm) Cuff size:   large  Vitals Entered By: Brenton Grills CMA (AAMA) (November 15, 2010 8:04 AM)  O2 Flow:  Room air CC: Follow-up visit/aj Is Patient Diabetic? Yes   Referring Provider:  Dr. Posey Rea Primary Provider:  Tresa Garter MD  CC:  Follow-up visit/aj.  History of Present Illness: pt was seen here for gynecomastia.  w/u was neg, including surgical eval.  pt states he feels well in general.  Current Medications (verified): 1)  Aldactazide 25-25 Mg Tabs (Spironolactone-Hctz) .Marland Kitchen.. 1 By Mouth Qam For Blood Pressure 2)  Oxycodone Hcl 15 Mg Tabs (Oxycodone Hcl) .Marland Kitchen.. 1 By Mouth Four Times A Day Fill On or After 10/19/2010 3)  Xanax 0.5 Mg Tabs (Alprazolam) .Marland Kitchen.. 1 By Mouth Two Times A Day As Needed Anxiety 4)  Cialis 20 Mg Tabs (Tadalafil) .Marland Kitchen.. 1 Tablet Every Other Day As Needed 5)  Vitamin D3 1000 Unit  Tabs (Cholecalciferol) .Marland Kitchen.. 1 By Mouth Daily 6)  Wellbutrin Xl 150 Mg Xr24h-Tab (Bupropion Hcl) .Marland Kitchen.. 1 By Mouth Once Daily - Generic  Allergies (verified): 1)  ! Erythromycin (Erythromycin)  Past History:  Past Medical History: Last updated: 07/13/2010 OSA      - PSG 05/24/10 AHI 90      - CPAP 13 cm H2O Diabetes mellitus, type II GERD Low back pain R eye ret. detachment 2009  Sinus polyps Obesity Hypertension Asthmatic bronchitis Anxiety/ panic disorder Depression  Review of Systems       The patient complains of peripheral edema.    Physical Exam  General:  morbidly obese.  no distress. Extremities:  1+ right pedal edema and 1+ left pedal edema.     Impression & Recommendations:  Problem # 1:  HYPOGONADISM  (ICD-257.2) secondary.  uncertain etiology  Problem # 2:  BREAST MASS, LEFT (ICD-611.72) due to gynecomastia, which is in turn due to #1  Medications Added to Medication List This Visit: 1)  Clomiphene Citrate 50 Mg Tabs (Clomiphene citrate) .... 1/4 tab once daily  Other Orders: Est. Patient Level III (28413)  Patient Instructions: 1)  normalization of testosterone is not known to harm you.  however, there are "theoretical" risks, including increased fertility, hair loss, prostate cancer, benign prostate enlargement, lower hdl ("good cholesterol"), sleep apnea, and behavior changes. 2)  start clomiphine 1/4 of 50 mg once daily. 3)  in 4-6 weeks, go to lab for testosterone 257.2.  please call [phone] to hear your test results. Prescriptions: CLOMIPHENE CITRATE 50 MG TABS (CLOMIPHENE CITRATE) 1/4 tab once daily  #10 x 2   Entered and Authorized by:   Minus Breeding MD   Signed by:   Minus Breeding MD on 11/15/2010   Method used:   Electronically to        [facility] [phone]* (retail)       [address]       [place], [place]  [address]       Ph: [phone]  Fax: [phone]   RxID:   [id number]    Orders Added: 1)  Est. Patient Level III [95284]

## 2010-12-14 NOTE — Miscellaneous (Signed)
Summary: Split night sleep study   Clinical Lists Changes AHI 90.3, O2 nadir 77%.  Severe sleep apnea.  CPAP 13 cm >> AHI 0.  No REM sleep.  Results d/w pt over the phone.  Will arrange for DME set up and set CPAP at 13 cm H2O.  His current machine is more than 60 yrs old.  Explained to him that he may need a new machine, but will have DME assess if his current machine is still functional. Orders: Added new Referral order of DME Referral (DME) - Signed

## 2010-12-14 NOTE — Assessment & Plan Note (Signed)
Summary: flu shot/plot/cd   Nurse Visit   Allergies: 1)  ! Erythromycin (Erythromycin)  Orders Added: 1)  Admin 1st Vaccine [90471] 2)  Flu Vaccine 24yrs + [54098] Flu Vaccine Consent Questions     Do you have a history of severe allergic reactions to this vaccine? no    Any prior history of allergic reactions to egg and/or gelatin? no    Do you have a sensitivity to the preservative Thimersol? no    Do you have a past history of Guillan-Barre Syndrome? no    Do you currently have an acute febrile illness? no    Have you ever had a severe reaction to latex? no    Vaccine information given and explained to patient? yes    Are you currently pregnant? no    Lot Number:[id number]   Exp Date:05/12/2011   Site Given  Left Deltoid IM .lbflu

## 2010-12-14 NOTE — Assessment & Plan Note (Signed)
Summary: fatigue,no energy/cd   Vital Signs:  Patient profile:   60 year old male Weight:      301 pounds BMI:     47.31 Temp:     97.4 degrees F Pulse rate:   64 / minute BP sitting:   152 / 98  (left arm) Cuff size:   large  Vitals Entered By: Lamar Sprinkles, CMA (Mar 31, 2010 4:43 PM) CC: Pt c/o constant daytime sleepyness. Sleep at night is disrupted by multiple "weird dreams" CBG Result 106 Comments Pt has omeprazole on hold while trying Vimovo. Please remove ketorolac, per pt he never needed medication.    CC:  Pt c/o constant daytime sleepyness. Sleep at night is disrupted by multiple "weird dreams".  History of Present Illness: C/o fatigue and sleepiness x2 d: falling asleep at the meeting, working on his computer etc. He stopped using CPAP machine 2 years ago  Current Medications (verified): 1)  Oxycodone Hcl 15 Mg Tabs (Oxycodone Hcl) .Marland Kitchen.. 1 By Mouth Four Times A Day 2)  Xanax 0.5 Mg Tabs (Alprazolam) .Marland Kitchen.. 1 By Mouth Two Times A Day As Needed Anxiety 3)  Ambien 10 Mg Tabs (Zolpidem Tartrate) .Marland Kitchen.. 1 At Bedtime As Needed 4)  Omeprazole 20 Mg Cpdr (Omeprazole) .Marland Kitchen.. 1 By Mouth Qd 5)  Cialis 20 Mg Tabs (Tadalafil) .Marland Kitchen.. 1 Tablet Every Other Day As Needed 6)  Hydrochlorothiazide 12.5 Mg  Tabs (Hydrochlorothiazide) .... Take 1 Tab  By Mouth Every Morning 7)  Ketorolac Tromethamine 10 Mg Tabs (Ketorolac Tromethamine) .Marland Kitchen.. 1 By Mouth Two Times A Day-Tid As Needed Renal Colic 8)  Vimovo 500-20 Mg Tbec (Naproxen-Esomeprazole) .Marland Kitchen.. 1 By Mouth Once Daily - Two Times A Day Pc As Needed Pain 9)  Pennsaid 1.5 % Soln (Diclofenac Sodium) .... 3-5 Gtt On Skin Three Times A Day For Pain 10)  Melatonin 5 Mg Tabs (Melatonin) .Marland Kitchen.. 1 At Bedtime For Sleep 11)  Vitamin D3 1000 Unit  Tabs (Cholecalciferol) .Marland Kitchen.. 1 By Mouth Daily  Allergies (verified): 1)  ! Erythromycin (Erythromycin)  Past History:  Past Surgical History: Last updated: 10/29/2009 CTS L Urethrotomies Sinus polyps    Social History: Last updated: 01/27/2010 Occupation: [employer] banker Married Former [employer]  Regular exercise-no  Past Medical History: OSA (Stopped CPAP 2009) Diabetes mellitus, type II GERD Low back pain R eye ret. detachment 2009  Sinus polyps Obesity Hypertension Asthmatic bronchitis Anxiety/ panic disorder Depression  Review of Systems       The patient complains of weight gain, dyspnea on exertion, difficulty walking, and depression.  The patient denies chest pain and abdominal pain.    Physical Exam  General:  alert and overweight-appearing.   Ears:  R ear normal and L ear normal.   Nose:  mild congestion Mouth:  Small oropharynx Neck:  No deformities, masses, or tenderness noted. Short Lungs:  CTA Heart:  RRR Abdomen:  S/NT Msk:  L lat forearm is tender in 12x6 cm area Lumbar-sacral spine is tender to palpation over paraspinal muscles and painfull with the ROM  Extremities:  No edematrace left pedal edema and trace right pedal edema.   Neurologic:  grosslly non focal  Skin:  No rash, no ecchymoses, and no petechiae.   Psych:  Oriented X3, good eye contact, and not anxious appearing.     Impression & Recommendations:  Problem # 1:  FATIGUE (ICD-780.79) poss due to OSA; r/o other issues Assessment New  Orders: Pulmonary Referral (Pulmonary) TLB-B12, Serum-Total ONLY (16109-U04) TLB-BMP (Basic Metabolic  Panel-BMET) (80048-METABOL) TLB-CBC Platelet - w/Differential (85025-CBCD) TLB-BNP (B-Natriuretic Peptide) (83880-BNPR) TLB-Hepatic/Liver Function Pnl (80076-HEPATIC) TLB-Sedimentation Rate (ESR) (85652-ESR) TLB-TSH (Thyroid Stimulating Hormone) (84443-TSH) TLB-Udip ONLY (81003-UDIP)  Problem # 2:  SOMNOLENCE (ICD-780.09) as above Assessment: New Nuvigil temporarily q am Orders: Pulmonary Referral (Pulmonary) TLB-B12, Serum-Total ONLY (66440-H47) TLB-BMP (Basic Metabolic Panel-BMET) (80048-METABOL) TLB-CBC Platelet - w/Differential  (85025-CBCD) TLB-BNP (B-Natriuretic Peptide) (83880-BNPR) TLB-Hepatic/Liver Function Pnl (80076-HEPATIC) TLB-Sedimentation Rate (ESR) (85652-ESR) TLB-TSH (Thyroid Stimulating Hormone) (84443-TSH) TLB-Udip ONLY (81003-UDIP)  Problem # 3:  OBSTRUCTIVE SLEEP APNEA (ICD-327.23) Assessment: Deteriorated  Orders: Pulmonary Referral (Pulmonary)  Complete Medication List: 1)  Oxycodone Hcl 15 Mg Tabs (Oxycodone hcl) .Marland Kitchen.. 1 by mouth four times a day 2)  Xanax 0.5 Mg Tabs (Alprazolam) .Marland Kitchen.. 1 by mouth two times a day as needed anxiety 3)  Ambien 10 Mg Tabs (Zolpidem tartrate) .Marland Kitchen.. 1 at bedtime as needed 4)  Omeprazole 20 Mg Cpdr (Omeprazole) .Marland Kitchen.. 1 by mouth qd 5)  Cialis 20 Mg Tabs (Tadalafil) .Marland Kitchen.. 1 tablet every other day as needed 6)  Ketorolac Tromethamine 10 Mg Tabs (Ketorolac tromethamine) .Marland Kitchen.. 1 by mouth two times a day-tid as needed renal colic 7)  Vimovo 500-20 Mg Tbec (Naproxen-esomeprazole) .Marland Kitchen.. 1 by mouth once daily - two times a day pc as needed pain 8)  Pennsaid 1.5 % Soln (Diclofenac sodium) .... 3-5 gtt on skin three times a day for pain 9)  Melatonin 5 Mg Tabs (Melatonin) .Marland Kitchen.. 1 at bedtime for sleep 10)  Vitamin D3 1000 Unit Tabs (Cholecalciferol) .Marland Kitchen.. 1 by mouth daily 11)  Nuvigil 150 Mg Tabs (Armodafinil) .Marland Kitchen.. 1 by mouth qam for being more alert 12)  Aldactazide 25-25 Mg Tabs (Spironolactone-hctz) .Marland Kitchen.. 1 by mouth qam for blood pressure  Other Orders: Capillary Blood Glucose/CBG (42595)  Patient Instructions: 1)  Call if you are not better in a reasonable amount of time or if worse. Go to ER if feeling really bad! 2)  No driving if sleepy! Prescriptions: ALDACTAZIDE 25-25 MG TABS (SPIRONOLACTONE-HCTZ) 1 by mouth qam for blood pressure  #30 x 12   Entered and Authorized by:   Tresa Garter MD   Signed by:   Tresa Garter MD on 03/31/2010   Method used:   Print then Give to Patient   RxID:   [id number] NUVIGIL 150 MG TABS (ARMODAFINIL) 1 by mouth qam for  being more alert  #30 x 6   Entered and Authorized by:   Tresa Garter MD   Signed by:   Tresa Garter MD on 03/31/2010   Method used:   Print then Give to Patient   RxID:   [id number]

## 2010-12-14 NOTE — Consult Note (Signed)
Summary: [facility]  [facility]   Imported By: Lennie Odor 09/27/2010 14:55:25  _____________________________________________________________________  External Attachment:    Type:   Image     Comment:   External Document

## 2010-12-14 NOTE — Medication Information (Signed)
Summary: Prior Auth/[facility]  Prior Auth/[facility]   Imported By: Lester [place] 04/13/2010 07:41:52  _____________________________________________________________________  External Attachment:    Type:   Image     Comment:   External Document

## 2010-12-14 NOTE — Progress Notes (Signed)
Summary: Oxycodone  Phone Note Call from Patient Call back at [phone]   Summary of Call: Patient stopped by the office this AM for Oxycodone RX for 01-22-10. Patient would like to come back between 2 and 4 pm. Initial call taken by: Lucious Groves,  January 19, 2010 8:09 AM  Follow-up for Phone Call        ok Follow-up by: Tresa Garter MD,  January 19, 2010 8:28 AM  Additional Follow-up for Phone Call Additional follow up Details #1::        prescription placed up front for pick up. Patient notified. Additional Follow-up by: Lucious Groves,  January 19, 2010 11:40 AM    New/Updated Medications: OXYCODONE HCL 15 MG  TABS (OXYCODONE HCL) 1 qid as needed pain fill on or after 01/22/2010 Prescriptions: OXYCODONE HCL 15 MG  TABS (OXYCODONE HCL) 1 qid as needed pain fill on or after 01/22/2010  #120 x 0   Entered by:   Lucious Groves   Authorized by:   Tresa Garter MD   Signed by:   Lucious Groves on 01/19/2010   Method used:   Print then Give to Patient   RxID:   [id number]

## 2010-12-14 NOTE — Progress Notes (Signed)
Summary: Oxycodone Rf  Phone Note Call from Patient Call back at Home Phone [phone]   Caller: Patient Summary of Call: pt req Rf on Oxycodone Initial call taken by: Lanier Prude, [facility]),  October 20, 2010 8:14 AM  Follow-up for Phone Call        ok per AVP Follow-up by: Lanier Prude, [facility]),  October 20, 2010 10:04 AM    New/Updated Medications: OXYCODONE HCL 15 MG TABS (OXYCODONE HCL) 1 by mouth four times a day Fill on or after 10/19/2010 Prescriptions: OXYCODONE HCL 15 MG TABS (OXYCODONE HCL) 1 by mouth four times a day Fill on or after 10/19/2010  #120 x 0   Entered by:   Lanier Prude, CMA(AAMA)   Authorized by:   Tresa Garter MD   Signed by:   Lanier Prude, [facility]) on 10/20/2010   Method used:   Print then Give to Patient   RxID:   [id number]

## 2010-12-14 NOTE — Progress Notes (Signed)
Summary: Rf Ambien  Phone Note Refill Request Message from:  Pharmacy  Refills Requested: Medication #1:  AMBIEN 10 MG TABS 1 at bedtime as needed   Dosage confirmed as above?Dosage Confirmed   Supply Requested: 30  Method Requested: Telephone to Pharmacy Initial call taken by: Lanier Prude, [facility]),  June 28, 2010 11:16 AM  Follow-up for Phone Call        ok 3 ref Follow-up by: Tresa Garter MD,  June 28, 2010 1:09 PM  Additional Follow-up for Phone Call Additional follow up Details #1::        Rx called to pharmacy Additional Follow-up by: Lanier Prude, [facility]),  June 28, 2010 1:34 PM    Prescriptions: AMBIEN 10 MG TABS (ZOLPIDEM TARTRATE) 1 at bedtime as needed  #30 x 3   Entered by:   Lanier Prude, [facility])   Authorized by:   Tresa Garter MD   Signed by:   Lanier Prude, CMA(AAMA) on 06/28/2010   Method used:   Telephoned to ...       [facility]  [address] [phone]* (retail)       [address]       [place], [place]  [address]       Ph: [phone]       Fax: [phone]   RxID:   [id number]

## 2010-12-19 ENCOUNTER — Other Ambulatory Visit: Payer: Self-pay | Admitting: Internal Medicine

## 2010-12-19 ENCOUNTER — Other Ambulatory Visit: Payer: Self-pay

## 2010-12-19 ENCOUNTER — Encounter (INDEPENDENT_AMBULATORY_CARE_PROVIDER_SITE_OTHER): Payer: Self-pay | Admitting: *Deleted

## 2010-12-19 ENCOUNTER — Other Ambulatory Visit: Payer: Managed Care, Other (non HMO)

## 2010-12-19 DIAGNOSIS — E890 Postprocedural hypothyroidism: Secondary | ICD-10-CM

## 2010-12-19 DIAGNOSIS — R079 Chest pain, unspecified: Secondary | ICD-10-CM

## 2010-12-19 DIAGNOSIS — R209 Unspecified disturbances of skin sensation: Secondary | ICD-10-CM

## 2010-12-19 LAB — CBC WITH DIFFERENTIAL/PLATELET
Basophils Absolute: 0 10*3/uL (ref 0.0–0.1)
Eosinophils Absolute: 0.3 10*3/uL (ref 0.0–0.7)
Lymphocytes Relative: 27.8 % (ref 12.0–46.0)
Lymphs Abs: 1.6 10*3/uL (ref 0.7–4.0)
MCHC: 35 g/dL (ref 30.0–36.0)
Monocytes Relative: 7.7 % (ref 3.0–12.0)
Platelets: 250 10*3/uL (ref 150.0–400.0)
RDW: 12.5 % (ref 11.5–14.6)

## 2010-12-19 LAB — HEPATIC FUNCTION PANEL
Alkaline Phosphatase: 39 U/L (ref 39–117)
Bilirubin, Direct: 0.1 mg/dL (ref 0.0–0.3)
Total Bilirubin: 0.5 mg/dL (ref 0.3–1.2)

## 2010-12-19 LAB — LIPID PANEL
HDL: 50.9 mg/dL (ref >39.00)
LDL Cholesterol: 97 mg/dL (ref 0–99)
Total CHOL/HDL Ratio: 3
VLDL: 21 mg/dL (ref 0.0–40.0)

## 2010-12-19 LAB — BASIC METABOLIC PANEL
CO2: 30 mEq/L (ref 19–32)
Calcium: 9.3 mg/dL (ref 8.4–10.5)
GFR: 93.91 mL/min (ref >60.00)
Sodium: 139 mEq/L (ref 135–145)

## 2010-12-19 LAB — TESTOSTERONE: Testosterone: 362.77 ng/dL (ref 350.00–890.00)

## 2010-12-19 LAB — HEMOGLOBIN A1C: Hgb A1c MFr Bld: 5.9 % (ref 4.6–6.5)

## 2010-12-19 LAB — TSH: TSH: 1.54 u[IU]/mL (ref 0.35–5.50)

## 2010-12-28 NOTE — Letter (Signed)
Summary: Letter of Medical Necessity / Estate manager/land agent of Medical Necessity / [facility]   Imported By: Lennie Odor 12/21/2010 10:05:06  _____________________________________________________________________  External Attachment:    Type:   Image     Comment:   External Document

## 2011-01-02 ENCOUNTER — Telehealth: Payer: Self-pay | Admitting: Endocrinology

## 2011-01-05 ENCOUNTER — Ambulatory Visit: Payer: Self-pay | Admitting: Pulmonary Disease

## 2011-01-09 ENCOUNTER — Telehealth: Payer: Self-pay | Admitting: Internal Medicine

## 2011-01-09 NOTE — Progress Notes (Signed)
Summary: Lab results  Phone Note Call from Patient Call back at Home Phone [phone]   Caller: Patient Summary of Call: Pt called stating results of testosterone are not on the [facility] system. Pt is requesting results. Initial call taken by: Margaret Pyle, CMA,  January 02, 2011 10:48 AM  Follow-up for Phone Call        normal same medication ret 4-6 months Follow-up by: Minus Breeding MD,  January 02, 2011 11:18 AM  Additional Follow-up for Phone Call Additional follow up Details #1::        Pt advised of above Additional Follow-up by: Margaret Pyle, CMA,  January 02, 2011 11:47 AM

## 2011-01-18 NOTE — Progress Notes (Signed)
Summary: RF  Phone Note Refill Request Call back at Home Phone [phone] Message from:  Patient on January 09, 2011 3:01 PM  Refills Requested: Medication #1:  OXYCODONE HCL 15 MG TABS 1 by mouth four times a day Fill on or after 12/16/2010 Patient would like to pick up on friday 01/12/1011 and also requesting a refill on Ambien  Initial call taken by: Rock Nephew CMA,  January 09, 2011 3:02 PM Caller: Patient  Follow-up for Phone Call        ok both Follow-up by: Tresa Garter MD,  January 09, 2011 5:18 PM  Additional Follow-up for Phone Call Additional follow up Details #1::        Left detailed vm that rxs were ready for pick up Additional Follow-up by: Lamar Sprinkles, CMA,  January 10, 2011 11:06 AM    New/Updated Medications: OXYCODONE HCL 15 MG TABS (OXYCODONE HCL) 1 by mouth four times a day Fill on or after 01/14/2011 AMBIEN 10 MG TABS (ZOLPIDEM TARTRATE) 1 at bedtime as needed Prescriptions: AMBIEN 10 MG TABS (ZOLPIDEM TARTRATE) 1 at bedtime as needed  #90 x 1   Entered by:   Lamar Sprinkles, CMA   Authorized by:   Tresa Garter MD   Signed by:   Lamar Sprinkles, CMA on 01/09/2011   Method used:   Print then Give to Patient   RxID:   [id number] OXYCODONE HCL 15 MG TABS (OXYCODONE HCL) 1 by mouth four times a day Fill on or after 01/14/2011  #120 x 0   Entered by:   Lamar Sprinkles, CMA   Authorized by:   Tresa Garter MD   Signed by:   Lamar Sprinkles, CMA on 01/09/2011   Method used:   Print then Give to Patient   RxID:   [id number]

## 2011-01-31 ENCOUNTER — Other Ambulatory Visit: Payer: Self-pay | Admitting: Internal Medicine

## 2011-01-31 NOTE — Telephone Encounter (Signed)
Ok to refill 

## 2011-02-06 ENCOUNTER — Other Ambulatory Visit (INDEPENDENT_AMBULATORY_CARE_PROVIDER_SITE_OTHER): Payer: Managed Care, Other (non HMO)

## 2011-02-06 DIAGNOSIS — Z0000 Encounter for general adult medical examination without abnormal findings: Secondary | ICD-10-CM

## 2011-02-06 DIAGNOSIS — Z0389 Encounter for observation for other suspected diseases and conditions ruled out: Secondary | ICD-10-CM

## 2011-02-06 LAB — URINALYSIS
Leukocytes, UA: NEGATIVE
Nitrite: NEGATIVE
Specific Gravity, Urine: 1.01 (ref 1.000–1.030)
Total Protein, Urine: NEGATIVE
pH: 6 (ref 5.0–8.0)

## 2011-02-06 LAB — HEPATIC FUNCTION PANEL
ALT: 24 U/L (ref 0–53)
AST: 25 U/L (ref 0–37)
Albumin: 3.7 g/dL (ref 3.5–5.2)
Alkaline Phosphatase: 39 U/L (ref 39–117)
Total Protein: 7 g/dL (ref 6.0–8.3)

## 2011-02-06 LAB — BASIC METABOLIC PANEL
BUN: 14 mg/dL (ref 6–23)
CO2: 32 mEq/L (ref 19–32)
Calcium: 9.2 mg/dL (ref 8.4–10.5)
Chloride: 104 mEq/L (ref 96–112)
Creatinine, Ser: 0.8 mg/dL (ref 0.4–1.5)
GFR: 100.42 mL/min (ref >60.00)
Glucose, Bld: 113 mg/dL — ABNORMAL HIGH (ref 70–99)
Potassium: 4.9 mEq/L (ref 3.5–5.1)
Sodium: 140 mEq/L (ref 135–145)

## 2011-02-06 LAB — CBC WITH DIFFERENTIAL/PLATELET
Basophils Absolute: 0 10*3/uL (ref 0.0–0.1)
Basophils Relative: 1 % (ref 0.0–3.0)
Eosinophils Absolute: 0.3 10*3/uL (ref 0.0–0.7)
Eosinophils Relative: 6.5 % — ABNORMAL HIGH (ref 0.0–5.0)
HCT: 38.1 % — ABNORMAL LOW (ref 39.0–52.0)
Hemoglobin: 13.1 g/dL (ref 13.0–17.0)
Lymphocytes Relative: 31.3 % (ref 12.0–46.0)
Lymphs Abs: 1.4 10*3/uL (ref 0.7–4.0)
MCHC: 34.4 g/dL (ref 30.0–36.0)
MCV: 95.7 fl (ref 78.0–100.0)
Monocytes Absolute: 0.4 10*3/uL (ref 0.1–1.0)
Monocytes Relative: 10 % (ref 3.0–12.0)
Neutro Abs: 2.3 10*3/uL (ref 1.4–7.7)
Neutrophils Relative %: 51.2 % (ref 43.0–77.0)
Platelets: 244 10*3/uL (ref 150.0–400.0)
RBC: 3.99 Mil/uL — ABNORMAL LOW (ref 4.22–5.81)
RDW: 12.4 % (ref 11.5–14.6)
WBC: 4.4 10*3/uL — ABNORMAL LOW (ref 4.5–10.5)

## 2011-02-06 LAB — TSH: TSH: 1.63 u[IU]/mL (ref 0.35–5.50)

## 2011-02-06 LAB — PSA: PSA: 0.44 ng/mL (ref 0.10–4.00)

## 2011-02-06 LAB — LIPID PANEL: Cholesterol: 171 mg/dL (ref 0–200)

## 2011-02-07 NOTE — Telephone Encounter (Signed)
OK to fill this prescription with additional refills x5 Thank you!  

## 2011-02-13 ENCOUNTER — Ambulatory Visit (INDEPENDENT_AMBULATORY_CARE_PROVIDER_SITE_OTHER): Payer: Managed Care, Other (non HMO) | Admitting: Internal Medicine

## 2011-02-13 ENCOUNTER — Encounter: Payer: Self-pay | Admitting: Internal Medicine

## 2011-02-13 DIAGNOSIS — R413 Other amnesia: Secondary | ICD-10-CM

## 2011-02-13 DIAGNOSIS — E119 Type 2 diabetes mellitus without complications: Secondary | ICD-10-CM

## 2011-02-13 DIAGNOSIS — M549 Dorsalgia, unspecified: Secondary | ICD-10-CM

## 2011-02-13 DIAGNOSIS — G4733 Obstructive sleep apnea (adult) (pediatric): Secondary | ICD-10-CM

## 2011-02-13 DIAGNOSIS — Z0000 Encounter for general adult medical examination without abnormal findings: Secondary | ICD-10-CM

## 2011-02-13 MED ORDER — OXYCODONE HCL 15 MG PO TABS: 15.0000 mg | ORAL_TABLET | Freq: Four times a day (QID) | ORAL | Status: DC

## 2011-02-13 NOTE — Assessment & Plan Note (Signed)
He is trying to eat better Wt Readings from Last 3 Encounters:  02/13/11 299 lb (135.626 kg)  12/06/10 302 lb (136.986 kg)  11/15/10 297 lb 8 oz (134.945 kg)

## 2011-02-13 NOTE — Assessment & Plan Note (Signed)
Not using CPAP.  

## 2011-02-13 NOTE — Assessment & Plan Note (Signed)
Chronic - meds refilled  Potential benefits of a long term opioids use as well as potential risks (i.e. addiction risk, apnea etc) and complications (i.e. Somnolence, constipation and others) were explained to the patient and were aknowledged.

## 2011-02-13 NOTE — Progress Notes (Signed)
Subjective:    Patient ID: Ronnie Curtis, male    DOB: 1951/07/10, 60 y.o.   MR.fu1 N: [id number]  HPI The patient is here for a wellness exam. The patient has been doing well overall without major physical or psychological issues going on lately.  The patient is here to follow up on OSA, chronic depression, anxiety, headaches and chronic LBP symptoms controlled with medicines, diet and exercise. Not using a CPAP.    Review of Systems  Constitutional: Negative for activity change, fatigue and unexpected weight change.  HENT: Negative for hearing loss and mouth sores.   Eyes: Negative for discharge.  Respiratory: Negative for chest tightness, shortness of breath and stridor.   Cardiovascular: Negative for chest pain and leg swelling.  Gastrointestinal: Negative for constipation.  Genitourinary: Negative for dysuria.  Musculoskeletal: Positive for back pain. Negative for myalgias.  Skin: Negative for rash.  Neurological: Negative for dizziness, syncope and weakness.  Psychiatric/Behavioral: Negative for dysphoric mood. The patient is not nervous/anxious.        Objective:   Physical Exam  Constitutional: He is oriented to person, place, and time. He appears well-developed. No distress.       Obese NAD  HENT:  Nose: Nose normal.  Mouth/Throat: Oropharynx is clear and moist.       Small oropharynx  Eyes: Conjunctivae are normal. Pupils are equal, round, and reactive to light.  Neck: Normal range of motion. No JVD present. No thyromegaly present.  Cardiovascular: Normal rate, regular rhythm, normal heart sounds and intact distal pulses.  Exam reveals no gallop and no friction rub.   No murmur heard. Pulmonary/Chest: Effort normal and breath sounds normal. No respiratory distress. He has no wheezes. He has no rales. He exhibits no tenderness.  Abdominal: Soft. Bowel sounds are normal. He exhibits no distension and no mass. There is no tenderness. There is no rebound and no  guarding.       He declined rectal exam  Genitourinary:       He declined exam today  Musculoskeletal: Normal range of motion. He exhibits tenderness (LS is tender w/ROM). He exhibits no edema.  Lymphadenopathy:    He has no cervical adenopathy.  Neurological: He is alert and oriented to person, place, and time. No cranial nerve deficit. He exhibits normal muscle tone. Coordination normal.  Skin: Skin is warm and dry. No rash noted. He is not diaphoretic.  Psychiatric: He has a normal mood and affect. His behavior is normal. Judgment and thought content normal.   Lab Results  Component Value Date   WBC 4.4* 02/06/2011   HGB 13.1 02/06/2011   HCT 38.1* 02/06/2011   PLT 244.0 02/06/2011   CHOL 171 02/06/2011   TRIG 88.0 02/06/2011   HDL 49.20 02/06/2011   LDLDIRECT 70.7 01/19/2009   ALT 24 02/06/2011   AST 25 02/06/2011   NA 140 02/06/2011   K 4.9 02/06/2011   CL 104 02/06/2011   CREATININE 0.8 02/06/2011   BUN 14 02/06/2011   CO2 32 02/06/2011   TSH 1.63 02/06/2011   PSA 0.44 02/06/2011   HGBA1C 5.9 12/19/2010          Assessment & Plan:  DIABETES MELLITUS, TYPE II Diet controlled  BACK PAIN Chronic - meds refilled  Potential benefits of a long term opioids use as well as potential risks (i.e. addiction risk, apnea etc) and complications (i.e. Somnolence, constipation and others) were explained to the patient and were aknowledged.   OBESITY, MORBID He  is trying to eat better Wt Readings from Last 3 Encounters:  02/13/11 299 lb (135.626 kg)  12/06/10 302 lb (136.986 kg)  11/15/10 297 lb 8 oz (134.945 kg)     MEMORY LOSS Better  OBSTRUCTIVE SLEEP APNEA Not using CPAP  Well adult exam Reviewed his problems and labs. Health related issues and tests were discussed.

## 2011-02-13 NOTE — Assessment & Plan Note (Signed)
Reviewed his problems and labs. Health related issues and tests were discussed.

## 2011-02-13 NOTE — Assessment & Plan Note (Signed)
Better  

## 2011-02-13 NOTE — Assessment & Plan Note (Signed)
Diet controlled.  

## 2011-03-07 ENCOUNTER — Ambulatory Visit: Payer: Self-pay | Admitting: Internal Medicine

## 2011-03-09 ENCOUNTER — Telehealth: Payer: Self-pay | Admitting: *Deleted

## 2011-03-09 NOTE — Telephone Encounter (Signed)
Patient requesting RF of oxycodone  

## 2011-03-10 NOTE — Telephone Encounter (Signed)
Ok if time 

## 2011-03-13 MED ORDER — OXYCODONE HCL 15 MG PO TABS: 15.0000 mg | ORAL_TABLET | Freq: Four times a day (QID) | ORAL | Status: DC

## 2011-03-13 NOTE — Telephone Encounter (Signed)
RX printed and pt notified to pick up 03/15/11

## 2011-03-27 NOTE — Letter (Signed)
Mar 26, 2007    [facility]  [address]  [place], [place] [address]   RE:  MARINUS, EICHER  MRN:  [id number]  /  DOB:  1951/10/01   To Whom It May Concern:   Mr. Felmlee has been a patient of mine for a number of years.  He is  suffering with morbid obesity, hypertension, elevated glucose, GERD, low  back pain and sleep apnea.  He has failed multiple attempts to lose  weight.  He is very motivated to undergo a lap-band surgery.  I am in  total support of his endeavor.  I think he is well motivated and  committed.   His weights were on March 03, 2003, 292 pounds; December 06, 2003, 292  pounds; Apr 06, 2004, 271 pounds; February 12, 2005, 285 pounds; April 27, 2005, 296 pounds; December 06, 2005, 326 pounds; Apr 07, 2006, 322  pounds; September 27, 2006, 295 pounds; February 12, 2007 321 pounds.    Sincerely,      Georgina Quint. Plotnikov, MD  Electronically Signed    AVP/MedQ  DD: 03/26/2007  DT: 03/26/2007  Job #: [id number]   CC:    Selena Lesser

## 2011-03-27 NOTE — Assessment & Plan Note (Signed)
[facility]                             PULMONARY OFFICE NOTE   NAME:Ronnie Curtis, Ronnie Curtis                  MRN:          [id number]  DATE:06/18/2007                            DOB:          [birth year]    REASON FOR CONSULTATION:  Chronic pneumonia.   CLINICAL HISTORY:  60 year-old white male says he has had  bronchitis in the past but not more than once or twice a year and was  an active marijuana smoker until 3 weeks ago.  He comes in now with a 6-  month history of a persistent pneumonia on chest x-ray but transiently  improves after antibiotic therapy and also seems better when he takes  albuterol.  He has been challenged with Advair in the past but did not  stay on it.  He presently complains of intermittent cough and congestion  with thick white mucus production.  Denies any pleuritic pain, fever,  chills, sweats or unexplained weight loss.  He is chronically short of  breath with doing anything more than slow ADLs and admits also to a  history of sleep apnea but rarely uses his approved sleep CPAP machine.   PAST MEDICAL HISTORY:  Significant for GERD (no endoscopy in records),  obesity and hypertension, diabetes and previous sinus disease status  post three separate sinus surgeries, the most recent in 2001.   ALLERGIES:  ERYTHROMYCIN, PROZAC, LISINOPRIL  - all with nonspecific  reactions.   MEDICATIONS:  Consist of fish oil, omeprazole, Benicar, metformin,  niacin and vitamin D.  Please see medication face sheet date 04/14/2007  for details.   SOCIAL HISTORY:  He quit smoking completely 3 weeks ago.  He works in  Retail banker.   FAMILY HISTORY:  Positive for allergies in his mother, lymphoma in his  mother.   REVIEW OF SYSTEMS:  Taken in detail on the work sheet, negative except  as outlined above.   PHYSICAL EXAMINATION:  GENERAL:  This is an obese, ambulatory white male  in no acute distress.  VITAL SIGNS:  He is afebrile.  HEENT:  Unremarkable, pharynx is clear and dentition is intact.  NECK:  Supple without cervical adenopathy or tenderness. Trachea is  midline without lymphadenopathy.  LUNGS:  Revealed minimum rhonchi bilaterally, air was adequate.  HEART:  There is a regular rhythm without murmur, rub, or gallop.  ABDOMEN:  Soft, benign.  EXTREMITIES:  No calf tenderness, cyanosis, clubbing or edema.   Most recent study in the chart for review is available from July 30 and  it indicates a right middle lobe consolidation with obstruction of the  right middle lobe lateral segmental bronchus with a 1.4 cm prevascular  lymph node (nonspecific).   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  I suspect  also a component of obesity induced reflux which may be contributing to  airways inflammation.  In this setting I recommended that he be re-  challenged with Advair at a dose of 250/50 b.i.d. for the purpose of  stimulating better mucociliary function and adding Mucinex  two b.i.d. to  improve mucus viscosity.   I have asked him to continue to treat himself vigorously for reflux by  using omeprazole preferably right before breakfast and reviewed a diet  with him.   Since he has an abnormality that is isolated in the right middle lobe  this most likely is not a bronchogenic carcinoma despite his smoking  history.  I have explained the right middle lobe syndrome to him and  using terms that I thought he could appreciate better, such as using the  analogy of a dysfunction escalator (that allows congestion to develop).  He agreed to return in 4 weeks for followup chest x-ray and if there are  still abnormalities present I believe a quick look bronchoscopy would be  appropriate.     Charlaine Dalton. Sherene Sires, MD, [facility]  Electronically Signed    MBW/MedQ  DD: 06/18/2007  DT: 06/18/2007  Job #: [id number]

## 2011-03-27 NOTE — Assessment & Plan Note (Signed)
[facility]                             PULMONARY OFFICE NOTE   NAME:Ronnie Curtis, Ronnie Curtis                  MRN:          [id number]  DATE:07/09/2007                            DOB:          1951/09/05    HISTORY:  This is a 60 year old white male who had been an active  marijuana smoker until July of 2008 and I saw him on August 6 with the  diagnosis of recurrent bronchitis and pneumonia with a persistent right  middle lobe infiltrate with obstruction of the right middle lobe  lateral bronchus segment by CT scan dated June 11, 2007.  I suspected  this was a right middle lobe syndrome and recommended the combination of  Advair 250 b.i.d. and Mucinex p.r.n.  He comes back today all smiles  feeling great.  He denies any pleuritic pain, fever, chills, sweats, or  cough.   PHYSICAL EXAMINATION:  GENERAL:  He is an obese, ambulatory white male  in no distress.  VITAL SIGNS:  Weight is 280 pounds with no change baseline.  HEENT:  Unremarkable.  NECK:  Clear.  LUNGS:  Clear bilaterally to auscultation and percussion, although  breath sounds are distant.  HEART:  Regular rate and rhythm without murmurs, rubs, or gallops.  ABDOMEN:  Soft.  EXTREMITIES:  Warm without calf tenderness, cyanosis, or clubbing.   Weight is 288 which is down intentionally 6 pounds from previous visit.   Chest x-ray shows marked improvement in aeration of the right middle  lobe which can only be with minimal residual changes that are  appreciated only on the lateral view.   IMPRESSION:  No evidence of right middle lobe obstruction or mass.  If  this were the case, we would not have seen such significant improvement  in terms of aeration.  This does not get [place] totally off the hook in this  patient with a history of smoking marijuana, although, certainly I do  not believe the risk is the same as if he were a cigarette smoker.   I explained all this to the patient and recommended  follow-up in 6 weeks  with PFT's at that point and a chest x-ray and consideration for  bronchoscopy to give him a clean bill of health if he has persistent  infiltrates that fail to clear completely.     Charlaine Dalton. Sherene Sires, MD, [facility]  Electronically Signed    MBW/MedQ  DD: 07/09/2007  DT: 07/09/2007  Job #: [id number]   cc:   Georgina Quint. Plotnikov, MD

## 2011-03-27 NOTE — Assessment & Plan Note (Signed)
[facility]                             PULMONARY OFFICE NOTE   NAME:Printz, LENVILLE HIBBERD                  MRN:          [id number]  DATE:09/09/2007                            DOB:          August 30, 1951    PULMONARY FINAL SUMMARY FOLLOWUP OFFICE VISIT   HISTORY:  A 60 year old white male with a history of smoking marijuana  but not tobacco products seen on June 18, 2007, with recurrent  pneumonia.  I thought he probably had acquired mucociliary dysfunction  complicated by right middle lobe syndrome and recommended a trial of  Advair 250/50 b.i.d. along with guaifenesin to help promote better  mucociliary function.  He returns today for followup chest x-ray, states  he has no further cough or dyspnea.  He also denies any pleuritic pain,  fevers, chills, sweats, orthopnea, PND or leg swelling.   PHYSICAL EXAMINATION:  He is a pleasant, ambulatory, obese white man in  no acute distress.  Weight 269 pounds, down voluntarily from a peak of  294.  He is afebrile with normal vital signs.  HEENT:  Unremarkable, oropharynx clear.  LUNG FIELDS:  Perfectly clear bilaterally to auscultation and  percussion.  HEART:  Regular rate and rhythm without murmur, gallop or rub.  ABDOMEN:  Soft, benign.  EXTREMITIES:  Warm without calf tenderness, cyanosis, clubbing, or  edema.   Chest x-ray is normal.  PFTs are normal.   IMPRESSION:  No evidence of significant airflow obstruction.  The  acquired mucociliary dysfunction has improved to the point where he now  has complete aeration of the right middle lobe.  He is at risk of  recurrent bronchitis and right middle lobe atelectasis, especially if he  resumes smoking in any form.   I therefore spent extra time reviewing his progress to date and  emphasizing the importance of eliminating all smoking products.  At this  point I think it would be appropriate to switch the Advair over to a  p.r.n. basis for any increasing  cough, congestion or wheeze to be  taken up to twice daily if symptomatic and tapered off completely if  having no symptoms at all.  The same could be said of Mucinex DM.   Pulmonary followup can be p.r.n. in this setting.  I did recommend a flu  shot and since he has never had a Pneumovax, I also recommended this  today and he agreed.     Charlaine Dalton. Sherene Sires, MD, [facility]  Electronically Signed   MBW/MedQ  DD: 09/09/2007  DT: 09/10/2007  Job #: [id number]   cc:   Georgina Quint. Plotnikov, MD

## 2011-03-30 NOTE — Assessment & Plan Note (Signed)
[facility]                           PRIMARY CARE OFFICE NOTE   NAME:Ronnie Curtis, Ronnie Curtis                  MRN:          [id number]  DATE:02/12/2007                            DOB:          07-Apr-1951    The patient is a 60 year old who presents for a wellness examination.   PAST MEDICAL HISTORY:  As per 02/12/05 note.   FAMILY HISTORY:  As per 02/12/05 note.   SOCIAL HISTORY:  As per 02/12/05 note. He is currently living with his  wife, daughter and granddaughter. His granddaughter is dying of cancer  and currently is starting under hospice care. It has been very  stressful.   CURRENT MEDICATIONS:  Reviewed. He is off his Prozac due to drowsiness.   REVIEW OF SYSTEMS:  No chest pain or shortness of breath. Unfortunately,  he is gaining weight. Emotionally upset about the situation, but denies  being depressed. The rest of the 18 point review of systems is negative.   PHYSICAL EXAMINATION:  VITAL SIGNS:  Blood pressure 121/74, pulse 101,  temperature 99.4, weight 321 pounds.  GENERAL:  He looks well and is no acute distress. He is overweight.  HEENT:  Small oral pharynx.  NECK:  Short and supple.  LUNGS:  Clear, no wheeze or rales.  HEART:  S1, S2, no murmur, no gallop.  ABDOMEN:  Soft and nontender, no organomegaly or mass.  EXTREMITIES:  With trace edema.  SKIN:  Clear. Aging changes.  NEUROLOGIC:  He is alert, oriented, and cooperative. He denies being  depressed. Non-focal.   LABORATORY DATA:  02/05/07: CBC normal, glucose 120, cholesterol 185, LDL  116, PSA normal, TSH normal, EKG with sinus tachycardia.   ASSESSMENT AND PLAN:  1. Normal wellness examination. Age/health related issues discussed.      Healthy lifestyle discussed. The danger of developing diabetes in      the view of his elevated sugar is discussed. He will try to do his      best.  2. Elevated glucose. Plan as above. Weight loss, low carbohydrate      diet, try to exercise  more.  3. Possible prostatitis with abnormal urinalysis. Cipro 500 mg p.o.      twice daily for ten days.  4. Chronic low back pain. Oxycodone 15 mg daily p.r.n., number 120, no      refills provided.  5. Colon polyps. He is due colonoscopy in June 2008.     Georgina Quint. Plotnikov, MD  Electronically Signed    AVP/MedQ  DD: 02/16/2007  DT: 02/17/2007  Job #: [id number]

## 2011-03-30 NOTE — Op Note (Signed)
NAME:  Ronnie Curtis, Ronnie Curtis           ACCOUNT NO.:  [id number]   MEDICAL RECORD NO.:  [id number]          PATIENT TYPE:  OIB   LOCATION:  2899                         FACILITY:  [facility]   PHYSICIAN:  Jefry H. Pollyann Kennedy, MD     DATE OF BIRTH:  [birth year]   DATE OF PROCEDURE:  05/14/2005  DATE OF DISCHARGE:                                 OPERATIVE REPORT   PREOPERATIVE DIAGNOSES:  1.  Nasal septal deviation.  2.  Extensive nasal and sinus pulposus.  3.  Chronic bilateral ethmoid sinusitis.  4.  Chronic bilateral frontal sinusitis.  5.  Obstructive sleep apnea syndrome.   POSTOPERATIVE DIAGNOSES:  1.  Nasal septal deviation.  2.  Extensive nasal and sinus pulposus.  3.  Chronic bilateral ethmoid sinusitis.  4.  Chronic bilateral frontal sinusitis.  5.  Obstructive sleep apnea syndrome.   PROCEDURE:  1.  Nasal septoplasty.  2.  Extensive bilateral nasal endoscopic polypectomy.  3.  Bilateral endoscopic total ethmoidectomy.  4.  Left endoscopic frontal sinusotomy.   SURGEON:  Jefry H. Pollyann Kennedy, MD.   ANESTHESIA:  General endotracheal anesthesia was used.   COMPLICATIONS:  No complications.   BLOOD LOSS:  150 mL.   FINDINGS:  Severe leftward septal deviation with a large bony spur  posteriorly and inferiorly on the left-hand side with adhesions of the  septum to the posterior aspect of the inferior turbinate. Anterior aspects  of the inferior turbinates were consistent with post turbinectomy. There is  extensive polyps filling the nasal cavities emanating from the superior and  the middle meatus bilaterally. There were also some polypoid changes of the  posterior aspect of the inferior turbinates. There is extensive polypoid and  hyperplastic mucosa of the ethmoid cells bilaterally. There is extensive of  polypoid disease in the frontal recess on the left-hand side.   REFERRING PHYSICIAN:  Georgina Quint. Plotnikov, M.D.   HISTORY:  This is a 60 year old gentleman with history of nasal  polyps,  status post nasal polyp surgery in the past.  Also history of obstructive  sleep apnea syndrome. Having difficult time using C-PAP because of severe  nasal obstruction. Risks, benefits, alternatives and complications of  procedure were explained to the patient who seemed to understand and agreed  surgery.   PROCEDURE:  The patient was taken to the operating room and placed on the  operating table in supine position. Following induction of general  endotracheal anesthesia, the patient was prepped and draped in standard  fashion.  Oxymetazoline spray was used preoperatively in nasal cavities. 1%  Xylocaine with epinephrine was infiltrated into the septum, columella, the  inferior turbinates, the polyps and the attachments of the middle turbinates  bilaterally. The Afrin soaked pledgets were then placed in the nasal  cavities bilaterally.  1 - Nasal septoplasty. Left hemitransfixion incision was created with a 15  scalpel and a mucoperichondrial flap was developed posteriorly down the left  side. Care was taken to elevate the mucosal flap off of the bony spur  without tearing the flap. The bony cartilaginous junction was divided and a  similar flap was developed on the  right side. The ethmoid plate was  deflected towards the left side. The majority of the ethmoid plate was  resected using Laren Boom rongeurs. The large vomerine spur was  resected as well. The quadrangle cartilage was in good position. The  adhesion was separated using a 15 scalpel. The mucosal incision was  reapproximated with 3-0 chromic suture and the septal flaps were quilted  with plain gut.  2 - Extensive bilateral endoscopic polypectomy. Using combination of 0 and  30 nasal endoscopes and the microdebrider, extensive polypectomy was  performed clearing out all polypoid masses from the nasal cavity arising  from the superior and middle meatus bilaterally. There was significant  bleeding associated with  the polyps bilaterally. Topical Afrin and bipolar  cautery were used for control of the bleeding.  3 - Bilateral endoscopic total ethmoidectomy. Once the middle turbinate was  fully exposed as the polypectomy was performed, then ethmoidectomy was  performed.  0 and 30 degree endoscopes along with the microdebrider and  through cut forceps were used to remove all the bony septations, all of the  hyperplastic mucosa and all of the polypoid material. The lamina papyracea  was kept intact bilaterally. The fovea was kept intact superiorly and  bilaterally. The ground lamella was taken down to expose the posterior  ethmoid cells. All ethmoid cells bilaterally contained hyperplastic mucosa  and polyps. There was significant bleeding associated with the ethmoidectomy  bilaterally as well and topical Afrin and bipolar cautery was used for  control of bleeding. On the right hand side the middle turbinate was  severely enlarged and contained a concha bullosa. The lateral lamina was  resected using a sickle knife and through cut forceps.  4 - Left frontal sinusotomy, endoscopic. After the ethmoid dissection was  into on the left-hand side, a 30 degree endoscope and curved suction was  used to explore the frontal recess area and cleaned of significant amount of  polypoid tissue. The frontal sinus was then entered with suction and  suctioned of all mucosal contents. This was not attempted on the right-hand  side due to thickening of the bone around the frontal recess, difficulty  with bleeding in the area and inability to be completely sure about  positioning, due to the bleeding. The nasal and sinus cavities were  suctioned and the ethmoid cavities were packed with Kyung Rudd packs and then  inflated with local anesthetic solution. Nasal cavities were suctioned and  packed with rolled up Telfa coated with bacitracin. The pharynx was suctioned of blood and secretions. The patient was then awakened,  extubated,  transferred to recovery in stable condition.       JHR/MEDQ  D:  05/14/2005  T:  05/14/2005  Job:  [id number]   cc:   Georgina Quint. Plotnikov, M.D. [facility]

## 2011-04-10 ENCOUNTER — Telehealth: Payer: Self-pay | Admitting: *Deleted

## 2011-04-10 NOTE — Telephone Encounter (Signed)
Patient requesting RF of oxycodone and xanax.

## 2011-04-10 NOTE — Telephone Encounter (Signed)
OK to fill this prescription with additional refills x0 -oxycod; x1 Xanax Thank you!

## 2011-04-11 MED ORDER — ALPRAZOLAM 0.5 MG PO TABS: 0.5000 mg | ORAL_TABLET | Freq: Every evening | ORAL | Status: DC | PRN

## 2011-04-11 MED ORDER — OXYCODONE HCL 15 MG PO TABS: 15.0000 mg | ORAL_TABLET | Freq: Four times a day (QID) | ORAL | Status: DC

## 2011-04-11 NOTE — Telephone Encounter (Signed)
Rx printed and pending sig

## 2011-04-15 ENCOUNTER — Other Ambulatory Visit: Payer: Self-pay | Admitting: Internal Medicine

## 2011-05-10 ENCOUNTER — Telehealth: Payer: Self-pay | Admitting: *Deleted

## 2011-05-10 MED ORDER — OXYCODONE HCL 15 MG PO TABS: 15.0000 mg | ORAL_TABLET | Freq: Four times a day (QID) | ORAL | Status: DC

## 2011-05-10 NOTE — Telephone Encounter (Signed)
OK to fill this prescription with additional refills x0 Thank you!  

## 2011-05-10 NOTE — Telephone Encounter (Signed)
Patient requesting RF of Oxycodone and needs to pick up RX tomorrow.

## 2011-05-10 NOTE — Telephone Encounter (Signed)
Pending signature - will be ready for pick up tomorrow pm, Patient informed

## 2011-05-29 ENCOUNTER — Other Ambulatory Visit: Payer: Self-pay | Admitting: Internal Medicine

## 2011-05-29 ENCOUNTER — Ambulatory Visit: Payer: Managed Care, Other (non HMO)

## 2011-05-29 DIAGNOSIS — Z23 Encounter for immunization: Secondary | ICD-10-CM

## 2011-05-29 MED ORDER — TETANUS-DIPHTH-ACELL PERTUSSIS 5-2.5-18.5 LF-MCG/0.5 IM SUSP: 0.5000 mL | Freq: Once | INTRAMUSCULAR | Status: DC

## 2011-06-11 ENCOUNTER — Telehealth: Payer: Self-pay | Admitting: *Deleted

## 2011-06-11 NOTE — Telephone Encounter (Signed)
Patient requesting RF of oxycodone  

## 2011-06-12 MED ORDER — OXYCODONE HCL 15 MG PO TABS: 15.0000 mg | ORAL_TABLET | Freq: Four times a day (QID) | ORAL | Status: DC

## 2011-06-12 NOTE — Telephone Encounter (Signed)
OK for reefill x 1 month

## 2011-06-12 NOTE — Telephone Encounter (Signed)
Patient notified and will pick up wed 06/13/11

## 2011-07-03 ENCOUNTER — Ambulatory Visit (INDEPENDENT_AMBULATORY_CARE_PROVIDER_SITE_OTHER): Payer: Managed Care, Other (non HMO) | Admitting: Internal Medicine

## 2011-07-03 ENCOUNTER — Encounter: Payer: Self-pay | Admitting: Internal Medicine

## 2011-07-03 VITALS — BP 120/92 | HR 72 | Temp 98.0°F | Resp 16 | Wt 300.0 lb

## 2011-07-03 DIAGNOSIS — N62 Hypertrophy of breast: Secondary | ICD-10-CM | POA: Insufficient documentation

## 2011-07-03 DIAGNOSIS — M545 Low back pain, unspecified: Secondary | ICD-10-CM

## 2011-07-03 DIAGNOSIS — E291 Testicular hypofunction: Secondary | ICD-10-CM

## 2011-07-03 DIAGNOSIS — I10 Essential (primary) hypertension: Secondary | ICD-10-CM

## 2011-07-03 MED ORDER — OXYCODONE HCL 15 MG PO TABS: 15.0000 mg | ORAL_TABLET | Freq: Four times a day (QID) | ORAL | Status: DC

## 2011-07-03 MED ORDER — TRIAMTERENE-HCTZ 37.5-25 MG PO TABS: 1.0000 | ORAL_TABLET | Freq: Every day | ORAL | Status: DC

## 2011-07-03 MED ORDER — CLOMIPHENE CITRATE 50 MG PO TABS: 12.5000 mg | ORAL_TABLET | Freq: Every day | ORAL | Status: DC

## 2011-07-03 NOTE — Assessment & Plan Note (Signed)
D/c Aldactazide

## 2011-07-03 NOTE — Progress Notes (Signed)
  Subjective:    Patient ID: Ronnie Curtis, male    DOB: 17-Jun-1951, 60 y.o.   MRN: [id number]  HPI  C/o L breast pain and swelling x 1 wk He used clomid x 1 mo and then stopped for ? reason  Review of Systems  Constitutional: Negative for appetite change, fatigue and unexpected weight change.  HENT: Negative for nosebleeds, congestion, sore throat, sneezing, trouble swallowing and neck pain.   Eyes: Negative for itching and visual disturbance.  Respiratory: Negative for cough.   Cardiovascular: Negative for chest pain, palpitations and leg swelling.  Gastrointestinal: Negative for nausea, diarrhea, blood in stool and abdominal distention.  Genitourinary: Negative for frequency and hematuria.  Musculoskeletal: Positive for back pain and arthralgias. Negative for joint swelling and gait problem.  Skin: Negative for rash.  Neurological: Negative for dizziness, tremors, speech difficulty and weakness.  Psychiatric/Behavioral: Negative for sleep disturbance, dysphoric mood and agitation. The patient is not nervous/anxious.        Objective:   Physical Exam  Constitutional: He is oriented to person, place, and time. He appears well-developed.       Obese  HENT:  Mouth/Throat: Oropharynx is clear and moist.  Eyes: Conjunctivae are normal. Pupils are equal, round, and reactive to light.  Neck: Normal range of motion. No JVD present. No thyromegaly present.  Cardiovascular: Normal rate, regular rhythm, normal heart sounds and intact distal pulses.  Exam reveals no gallop and no friction rub.   No murmur heard. Pulmonary/Chest: Effort normal and breath sounds normal. No respiratory distress. He has no wheezes. He has no rales. He exhibits no tenderness.  Abdominal: Soft. Bowel sounds are normal. He exhibits no distension and no mass. There is no tenderness. There is no rebound and no guarding.  Musculoskeletal: Normal range of motion. He exhibits no edema and no tenderness.    Lymphadenopathy:    He has no cervical adenopathy.  Neurological: He is alert and oriented to person, place, and time. He has normal reflexes. No cranial nerve deficit. He exhibits normal muscle tone. Coordination normal.  Skin: Skin is warm and dry. No rash noted.  Psychiatric: He has a normal mood and affect. His behavior is normal. Judgment and thought content normal.     7x3 mm oval subcutaneous lump at 3 o'clock on  L breast 4-5 cm from the nipple center - tender     Assessment & Plan:

## 2011-07-03 NOTE — Patient Instructions (Signed)
Restart Clomid in 1 mo or when the left breast is back to normal

## 2011-07-03 NOTE — Assessment & Plan Note (Signed)
F/u w/Dr Everardo All in 2 mo

## 2011-07-03 NOTE — Assessment & Plan Note (Signed)
On Rx 

## 2011-07-03 NOTE — Assessment & Plan Note (Signed)
Start Maxzide 

## 2011-08-07 ENCOUNTER — Telehealth: Payer: Self-pay | Admitting: *Deleted

## 2011-08-07 MED ORDER — OXYCODONE HCL 15 MG PO TABS: 15.0000 mg | ORAL_TABLET | Freq: Four times a day (QID) | ORAL | Status: DC

## 2011-08-07 NOTE — Telephone Encounter (Signed)
Pending signature

## 2011-08-07 NOTE — Telephone Encounter (Signed)
OK to fill this prescription with additional refills x0 Thank you!  

## 2011-08-07 NOTE — Telephone Encounter (Signed)
Patient requesting RF of OXYCODONE

## 2011-08-08 NOTE — Telephone Encounter (Signed)
Left detailed mess informing pt rx ready for p/u.

## 2011-08-28 ENCOUNTER — Ambulatory Visit (INDEPENDENT_AMBULATORY_CARE_PROVIDER_SITE_OTHER): Payer: Managed Care, Other (non HMO) | Admitting: Endocrinology

## 2011-08-28 ENCOUNTER — Encounter: Payer: Self-pay | Admitting: Endocrinology

## 2011-08-28 VITALS — BP 130/86 | HR 71 | Temp 97.5°F | Ht 67.0 in | Wt 305.2 lb

## 2011-08-28 DIAGNOSIS — E291 Testicular hypofunction: Secondary | ICD-10-CM

## 2011-08-28 NOTE — Progress Notes (Signed)
Subjective:    Patient ID: Ronnie Curtis, male    DOB: 03/08/1951, 60 y.o.   MRN: [id number]  HPI Pt went off clomid after 2 months.  On clomid 1/4 of 50 mg qd, testosterone was 427.   Past Medical History  Diagnosis Date  . OSA (obstructive sleep apnea)     PSG 05/24/10 AHI 90, CPAP 13cm H2O  . Type II or unspecified type diabetes mellitus without mention of complication, not stated as uncontrolled   . GERD (gastroesophageal reflux disease)   . LBP (low back pain)   . Retinal detachment 2009    Right  . Multiple polyps of ethmoid sinus   . Obesity   . HTN (hypertension)   . Asthmatic bronchitis   . Anxiety     Panic disorder  . Depression     Past Surgical History  Procedure Date  . Carpal tunnel release     Left  . Urethrotomy   . Sinus polyps   . Tonsillectomy     History   Social History  . Marital Status: Married    Spouse Name: N/A    Number of Children: 3  . Years of Education: N/A   Occupational History  . [employer] banker    Social History Main Topics  . Smoking status: Never Smoker   . Smokeless tobacco: Not on file  . Alcohol Use: 0.0 oz/week    1-2 Cans of beer per week  . Drug Use: No  . Sexually Active: Yes   Other Topics Concern  . Not on file   Social History Narrative   Occasional THC use - deniesRegular Exercise -  NO    Current Outpatient Prescriptions on File Prior to Visit  Medication Sig Dispense Refill  . aspirin 81 MG EC tablet Take 81 mg by mouth daily.        . Cholecalciferol 1000 UNITS tablet Take 1,000 Units by mouth daily.        Marland Kitchen omeprazole (PRILOSEC) 40 MG capsule Take 40 mg by mouth 2 (two) times daily. For indigestion       . oxyCODONE (ROXICODONE) 15 MG immediate release tablet Take 1 tablet (15 mg total) by mouth 4 (four) times daily. Ok to fill on or after 08/14/11  120 tablet  0  . tadalafil (CIALIS) 20 MG tablet Take 20 mg by mouth every other day as needed.        . triamterene-hydrochlorothiazide (MAXZIDE-25)  37.5-25 MG per tablet Take 1 tablet by mouth daily.  90 tablet  3   Current Facility-Administered Medications on File Prior to Visit  Medication Dose Route Frequency Provider Last Rate Last Dose  . TDaP (BOOSTRIX) injection 0.5 mL  0.5 mL Intramuscular Once Sonda Primes, MD        Allergies  Allergen Reactions  . Erythromycin   . Spironolactone     gynecomastia    Family History  Problem Relation Age of Onset  . Hypertension Other   . Allergies Mother   . Lymphoma Mother   . Cancer Mother     lymphoma  . Heart disease Father     CAD, CHF  . Hypertension Brother   . Arthritis Brother     OA   BP 130/86  Pulse 71  Temp(Src) 97.5 F (36.4 C) (Oral)  Ht 5\' 7"  (1.702 m)  Wt 305 lb 4 oz (138.46 kg)  BMI 47.81 kg/m2  SpO2 96%  Review of Systems Denies decreased urinary stream.  Objective:   Physical Exam VITAL SIGNS:  See vs page GENERAL: no distress.  Obese GENITALIA: Normal male testicles, scrotum, and penis.    Lab Results  Component Value Date   TESTOSTERONE 362.77 12/19/2010      Assessment & Plan:  Hypogonadism.  He should resume rx.

## 2011-08-28 NOTE — Patient Instructions (Addendum)
Resume clomiphine, 1/4 pill per day. blood tests are being requested for you, to get done in approx 1 month.  please call [phone] to hear your test results.  You will be prompted to enter the 9-digit "MRN" number that appears at the top left of this page, followed by #.  Then you will hear the message.   Please return in 1 year. normalization of testosterone is not known to harm you.  however, there are "theoretical" risks, including increased fertility, hair loss, prostate cancer, benign prostate enlargement, blood clots, liver problems, lower hdl ("good cholesterol"), sleep apnea, and behavior changes.

## 2011-08-30 ENCOUNTER — Telehealth: Payer: Self-pay | Admitting: *Deleted

## 2011-08-30 NOTE — Telephone Encounter (Signed)
Pt advised of below. I advised him to go to UC or call tom for Sat clinic appt.

## 2011-08-30 NOTE — Telephone Encounter (Signed)
Pt left vm stating he had a cold last week and it has turned into bronchitis. He is requesting Rx for this as he is leaving town for vacation Sat am. Left detailed mess advising pt PCP out of office all week. Please advise.

## 2011-08-30 NOTE — Telephone Encounter (Signed)
Pt states he was just seen by Dr. Everardo All recently and mentioned this problem to him. He would like to know if he will send in rx in AVP's absence. Please advise.

## 2011-08-30 NOTE — Telephone Encounter (Signed)
i reviewed ov, and i see no mention of this.  Please advise of

## 2011-09-07 ENCOUNTER — Other Ambulatory Visit: Payer: Self-pay

## 2011-09-07 NOTE — Telephone Encounter (Signed)
Please advise on refill.

## 2011-09-11 ENCOUNTER — Telehealth: Payer: Self-pay | Admitting: *Deleted

## 2011-09-11 MED ORDER — OXYCODONE HCL 15 MG PO TABS: 15.0000 mg | ORAL_TABLET | Freq: Four times a day (QID) | ORAL | Status: DC

## 2011-09-11 NOTE — Telephone Encounter (Signed)
OK to fill this prescription if time with additional refills x0 Thank you!

## 2011-09-11 NOTE — Telephone Encounter (Signed)
Patient requesting RF of Oxycodone.

## 2011-09-11 NOTE — Telephone Encounter (Signed)
Pending signature

## 2011-09-12 NOTE — Telephone Encounter (Signed)
Left detailed mess informing pt rx ready.

## 2011-10-01 ENCOUNTER — Other Ambulatory Visit (INDEPENDENT_AMBULATORY_CARE_PROVIDER_SITE_OTHER): Payer: Managed Care, Other (non HMO)

## 2011-10-01 ENCOUNTER — Other Ambulatory Visit: Payer: Self-pay | Admitting: Endocrinology

## 2011-10-01 DIAGNOSIS — E291 Testicular hypofunction: Secondary | ICD-10-CM

## 2011-10-01 LAB — TESTOSTERONE: Testosterone: 277.19 ng/dL — ABNORMAL LOW (ref 350.00–890.00)

## 2011-10-01 MED ORDER — CLOMIPHENE CITRATE 50 MG PO TABS: ORAL_TABLET | ORAL | Status: DC

## 2011-10-08 ENCOUNTER — Telehealth: Payer: Self-pay

## 2011-10-08 NOTE — Telephone Encounter (Signed)
Patient called [facility] triage requesting refill for oxycodone

## 2011-10-08 NOTE — Telephone Encounter (Signed)
OK to fill this prescription with additional refills x0. Sch OV pls Thank you!  

## 2011-10-09 ENCOUNTER — Telehealth: Payer: Self-pay | Admitting: Internal Medicine

## 2011-10-09 MED ORDER — OXYCODONE HCL 15 MG PO TABS: 15.0000 mg | ORAL_TABLET | Freq: Four times a day (QID) | ORAL | Status: DC

## 2011-10-09 NOTE — Telephone Encounter (Signed)
Needs ROV w/Dr Purvis Sheffield

## 2011-10-09 NOTE — Telephone Encounter (Signed)
Rx signed/upfront. Left mess for patient to call back.

## 2011-10-09 NOTE — Telephone Encounter (Signed)
Pt came in to office to p/u Rx. Advised front desk to have pt schedule OV today while he is in office.

## 2011-10-09 NOTE — Telephone Encounter (Signed)
Message copied by Janeal Holmes on Tue Oct 09, 2011 10:21 AM ------      Message from: Romero Belling      Created: Mon Oct 01, 2011  7:45 PM       i left message on phone tree      Increase clomid to 1/2 tab qd      Go back to lab in 30d

## 2011-10-12 ENCOUNTER — Ambulatory Visit: Payer: Managed Care, Other (non HMO) | Admitting: Internal Medicine

## 2011-10-15 ENCOUNTER — Encounter: Payer: Self-pay | Admitting: Internal Medicine

## 2011-10-15 ENCOUNTER — Ambulatory Visit (INDEPENDENT_AMBULATORY_CARE_PROVIDER_SITE_OTHER): Payer: Managed Care, Other (non HMO) | Admitting: Internal Medicine

## 2011-10-15 DIAGNOSIS — R413 Other amnesia: Secondary | ICD-10-CM

## 2011-10-15 DIAGNOSIS — K219 Gastro-esophageal reflux disease without esophagitis: Secondary | ICD-10-CM

## 2011-10-15 DIAGNOSIS — E119 Type 2 diabetes mellitus without complications: Secondary | ICD-10-CM

## 2011-10-15 DIAGNOSIS — M545 Low back pain, unspecified: Secondary | ICD-10-CM

## 2011-10-15 DIAGNOSIS — N529 Male erectile dysfunction, unspecified: Secondary | ICD-10-CM

## 2011-10-15 DIAGNOSIS — E291 Testicular hypofunction: Secondary | ICD-10-CM

## 2011-10-15 DIAGNOSIS — I10 Essential (primary) hypertension: Secondary | ICD-10-CM

## 2011-10-15 MED ORDER — VARDENAFIL HCL 10 MG PO TBDP: 10.0000 mg | ORAL_TABLET | Freq: Every day | ORAL | Status: DC | PRN

## 2011-10-15 MED ORDER — VALSARTAN 160 MG PO TABS: 160.0000 mg | ORAL_TABLET | Freq: Every day | ORAL | Status: DC

## 2011-10-15 NOTE — Assessment & Plan Note (Signed)
Continue with current prescription therapy as reflected on the Med list.  

## 2011-10-15 NOTE — Patient Instructions (Signed)
BP Readings from Last 3 Encounters:  10/15/11 140/90  08/28/11 130/86  07/03/11 120/92   Wt Readings from Last 3 Encounters:  10/15/11 314 lb (142.429 kg)  08/28/11 305 lb 4 oz (138.46 kg)  07/03/11 300 lb (136.079 kg)

## 2011-10-15 NOTE — Assessment & Plan Note (Signed)
Wt Readings from Last 3 Encounters:  10/15/11 314 lb (142.429 kg)  08/28/11 305 lb 4 oz (138.46 kg)  07/03/11 300 lb (136.079 kg)

## 2011-10-15 NOTE — Progress Notes (Signed)
  Subjective:    Patient ID: Ronnie Curtis, male    DOB: 20-Nov-1950, 60 y.o.   MRN: [id number]  HPI The patient presents for a follow-up of  chronic LBP, chronic hypogonadism, HTN previously controlled with medicines     Review of Systems  Constitutional: Negative for appetite change, fatigue and unexpected weight change.  HENT: Negative for nosebleeds, congestion, sore throat, sneezing, trouble swallowing and neck pain.   Eyes: Negative for itching and visual disturbance.  Respiratory: Negative for cough.   Cardiovascular: Negative for chest pain, palpitations and leg swelling.  Gastrointestinal: Negative for nausea, diarrhea, blood in stool and abdominal distention.  Genitourinary: Negative for frequency and hematuria.  Musculoskeletal: Positive for back pain. Negative for joint swelling and gait problem.  Skin: Negative for rash.  Neurological: Negative for dizziness, tremors, speech difficulty and weakness.  Psychiatric/Behavioral: Negative for suicidal ideas, sleep disturbance, dysphoric mood and agitation. The patient is not nervous/anxious.        Objective:   Physical Exam  Constitutional: He is oriented to person, place, and time. He appears well-developed.       Very obese  HENT:  Mouth/Throat: Oropharynx is clear and moist.  Eyes: Conjunctivae are normal. Pupils are equal, round, and reactive to light.  Neck: Normal range of motion. No JVD present. No thyromegaly present.  Cardiovascular: Normal rate, regular rhythm, normal heart sounds and intact distal pulses.  Exam reveals no gallop and no friction rub.   No murmur heard. Pulmonary/Chest: Effort normal and breath sounds normal. No respiratory distress. He has no wheezes. He has no rales. He exhibits no tenderness.  Abdominal: Soft. Bowel sounds are normal. He exhibits no distension and no mass. There is no tenderness. There is no rebound and no guarding.  Musculoskeletal: Normal range of motion. He exhibits  tenderness (LS is tender). He exhibits no edema.  Lymphadenopathy:    He has no cervical adenopathy.  Neurological: He is alert and oriented to person, place, and time. He has normal reflexes. No cranial nerve deficit. He exhibits normal muscle tone. Coordination normal.  Skin: Skin is warm and dry. No rash noted.  Psychiatric: He has a normal mood and affect. His behavior is normal. Judgment and thought content normal.     Lab Results  Component Value Date   WBC 4.4* 02/06/2011   HGB 13.1 02/06/2011   HCT 38.1* 02/06/2011   PLT 244.0 02/06/2011   GLUCOSE 113* 02/06/2011   CHOL 171 02/06/2011   TRIG 88.0 02/06/2011   HDL 49.20 02/06/2011   LDLDIRECT 70.7 01/19/2009   LDLCALC 104* 02/06/2011   ALT 24 02/06/2011   AST 25 02/06/2011   NA 140 02/06/2011   K 4.9 02/06/2011   CL 104 02/06/2011   CREATININE 0.8 02/06/2011   BUN 14 02/06/2011   CO2 32 02/06/2011   TSH 1.63 02/06/2011   PSA 0.44 02/06/2011   HGBA1C 5.9 12/19/2010        Assessment & Plan:

## 2011-10-15 NOTE — Assessment & Plan Note (Signed)
F/u w/Dr Ellison 

## 2011-10-15 NOTE — Assessment & Plan Note (Signed)
BP Readings from Last 3 Encounters:  10/15/11 140/90  08/28/11 130/86  07/03/11 120/92  Continue with current prescription therapy as reflected on the Med list.

## 2011-10-15 NOTE — Assessment & Plan Note (Signed)
Better w/less stress at work

## 2011-10-15 NOTE — Assessment & Plan Note (Signed)
Diet discuseed along w/wt loss

## 2011-10-25 ENCOUNTER — Telehealth: Payer: Self-pay | Admitting: Internal Medicine

## 2011-10-25 NOTE — Telephone Encounter (Signed)
Message copied by Janeal Holmes on Thu Oct 25, 2011  8:02 PM ------      Message from: Romero Belling      Created: Mon Oct 01, 2011  7:45 PM       i left message on phone tree      Increase clomid to 1/2 tab qd      Go back to lab in 30d

## 2011-10-25 NOTE — Telephone Encounter (Signed)
Noted. Thx.

## 2011-10-30 ENCOUNTER — Other Ambulatory Visit: Payer: Self-pay | Admitting: Endocrinology

## 2011-10-30 ENCOUNTER — Other Ambulatory Visit (INDEPENDENT_AMBULATORY_CARE_PROVIDER_SITE_OTHER): Payer: Managed Care, Other (non HMO)

## 2011-10-30 DIAGNOSIS — E291 Testicular hypofunction: Secondary | ICD-10-CM

## 2011-11-07 ENCOUNTER — Other Ambulatory Visit: Payer: Self-pay

## 2011-11-07 NOTE — Telephone Encounter (Signed)
Pt called requesting refill of Oxycodone. Last filled 10/09/2011

## 2011-11-12 ENCOUNTER — Other Ambulatory Visit: Payer: Self-pay | Admitting: *Deleted

## 2011-11-12 MED ORDER — OXYCODONE HCL 15 MG PO TABS: 15.0000 mg | ORAL_TABLET | Freq: Four times a day (QID) | ORAL | Status: DC

## 2011-11-12 NOTE — Telephone Encounter (Signed)
Pt calling again today req below. Eh also asks that the fill on date be changed to today. Please advise.

## 2011-11-12 NOTE — Progress Notes (Signed)
Patient walk-in office--waiting on Rx. Rx Roiycodone printed, signed & given to Pt.

## 2011-12-05 ENCOUNTER — Other Ambulatory Visit: Payer: Self-pay

## 2011-12-05 NOTE — Telephone Encounter (Signed)
Patient requesting a refill on his oxycodone. Call [phone] when ready to pickup.

## 2011-12-05 NOTE — Telephone Encounter (Signed)
OK to fill this prescription with additional refills x0 Thank you!  

## 2011-12-06 MED ORDER — OXYCODONE HCL 15 MG PO TABS: 15.0000 mg | ORAL_TABLET | Freq: Four times a day (QID) | ORAL | Status: DC

## 2011-12-06 NOTE — Telephone Encounter (Signed)
Left detailed mess informing pt rx ready for p/u later today.

## 2011-12-07 ENCOUNTER — Telehealth: Payer: Self-pay | Admitting: *Deleted

## 2011-12-07 NOTE — Telephone Encounter (Signed)
He is not supposed to use more med than prescribed. He should not run out early. Thx

## 2011-12-07 NOTE — Telephone Encounter (Signed)
Pt left vm stating his rx he p/u has fill on or after 12-15-11. He needs this changed because he will run out before the 2nd. Please advise- ok to change/reprint Rx?

## 2011-12-12 NOTE — Telephone Encounter (Signed)
Ok Thx 

## 2011-12-12 NOTE — Telephone Encounter (Signed)
RC from pt.  Pt was given 60 day supply since 12/2.  As December and January have 31 days each, the refill period listed on his scripts is 62 days.  Pt will run out today.  Can script be modified so pt can pick up early?  Thanks.

## 2011-12-13 MED ORDER — OXYCODONE HCL 15 MG PO TABS: 15.0000 mg | ORAL_TABLET | Freq: Four times a day (QID) | ORAL | Status: DC

## 2011-12-13 NOTE — Telephone Encounter (Signed)
Rx corrected and signed by Dr. Sanda Linger is AVP's absence. Original Rx dated fill on or after 2.2.13  shredded.

## 2012-01-01 ENCOUNTER — Other Ambulatory Visit: Payer: Self-pay | Admitting: *Deleted

## 2012-01-01 MED ORDER — TRIAMTERENE-HCTZ 37.5-25 MG PO TABS: 1.0000 | ORAL_TABLET | Freq: Every day | ORAL | Status: DC

## 2012-01-03 ENCOUNTER — Telehealth: Payer: Self-pay | Admitting: *Deleted

## 2012-01-03 ENCOUNTER — Other Ambulatory Visit: Payer: Self-pay | Admitting: *Deleted

## 2012-01-03 MED ORDER — OXYCODONE HCL 15 MG PO TABS: 15.0000 mg | ORAL_TABLET | Freq: Four times a day (QID) | ORAL | Status: DC

## 2012-01-03 MED ORDER — TRIAMTERENE-HCTZ 37.5-25 MG PO TABS: 1.0000 | ORAL_TABLET | Freq: Every day | ORAL | Status: DC

## 2012-01-03 NOTE — Telephone Encounter (Signed)
Rx printed for signature. 

## 2012-01-03 NOTE — Telephone Encounter (Signed)
Pt req Rf on Roxicodone 15 mg. Please advise

## 2012-01-03 NOTE — Telephone Encounter (Signed)
OK to fill this prescription with additional refills x0 Sch OV Thank you!  

## 2012-01-04 MED ORDER — OXYCODONE HCL 15 MG PO TABS: 15.0000 mg | ORAL_TABLET | Freq: Four times a day (QID) | ORAL | Status: DC

## 2012-01-04 NOTE — Telephone Encounter (Signed)
Pt informed Rx ready for p/u.

## 2012-02-01 ENCOUNTER — Telehealth: Payer: Self-pay

## 2012-02-01 NOTE — Telephone Encounter (Signed)
OK to fill this prescription with additional refills x0 Needs OV Thank you!  

## 2012-02-01 NOTE — Telephone Encounter (Signed)
Pt called requesting a refill of Oxycodone. ?

## 2012-02-04 ENCOUNTER — Telehealth: Payer: Self-pay | Admitting: *Deleted

## 2012-02-04 DIAGNOSIS — Z0000 Encounter for general adult medical examination without abnormal findings: Secondary | ICD-10-CM

## 2012-02-04 DIAGNOSIS — Z0389 Encounter for observation for other suspected diseases and conditions ruled out: Secondary | ICD-10-CM

## 2012-02-04 MED ORDER — OXYCODONE HCL 15 MG PO TABS: 15.0000 mg | ORAL_TABLET | Freq: Four times a day (QID) | ORAL | Status: DC

## 2012-02-04 NOTE — Telephone Encounter (Signed)
Rx printed/pending MD sig. 

## 2012-02-04 NOTE — Telephone Encounter (Signed)
CPE 04/18/12 labs entered.

## 2012-02-04 NOTE — Telephone Encounter (Signed)
Pt informed Rx ready for p/u.

## 2012-03-03 ENCOUNTER — Other Ambulatory Visit: Payer: Self-pay

## 2012-03-03 NOTE — Telephone Encounter (Signed)
He needs ov thx

## 2012-03-03 NOTE — Telephone Encounter (Signed)
Pt has OV 03/18/12. Can he have Rx to last him until then?

## 2012-03-03 NOTE — Telephone Encounter (Signed)
Pt advised to schedule OV via VM

## 2012-03-03 NOTE — Telephone Encounter (Signed)
Pt called requesting a refill of Oxycodone, please advise.

## 2012-03-04 NOTE — Telephone Encounter (Signed)
Ok thx.

## 2012-03-05 ENCOUNTER — Ambulatory Visit (INDEPENDENT_AMBULATORY_CARE_PROVIDER_SITE_OTHER): Payer: Managed Care, Other (non HMO) | Admitting: Internal Medicine

## 2012-03-05 ENCOUNTER — Encounter: Payer: Self-pay | Admitting: Internal Medicine

## 2012-03-05 VITALS — BP 142/90 | HR 77 | Temp 98.6°F | Resp 18 | Wt 309.8 lb

## 2012-03-05 DIAGNOSIS — I10 Essential (primary) hypertension: Secondary | ICD-10-CM

## 2012-03-05 DIAGNOSIS — M545 Low back pain, unspecified: Secondary | ICD-10-CM

## 2012-03-05 DIAGNOSIS — R413 Other amnesia: Secondary | ICD-10-CM

## 2012-03-05 DIAGNOSIS — G4733 Obstructive sleep apnea (adult) (pediatric): Secondary | ICD-10-CM

## 2012-03-05 DIAGNOSIS — M25569 Pain in unspecified knee: Secondary | ICD-10-CM

## 2012-03-05 MED ORDER — METHYLPREDNISOLONE ACETATE 80 MG/ML IJ SUSP: 40.0000 mg | Freq: Once | INTRAMUSCULAR | Status: DC

## 2012-03-05 MED ORDER — OXYCODONE HCL 15 MG PO TABS: 15.0000 mg | ORAL_TABLET | Freq: Four times a day (QID) | ORAL | Status: DC

## 2012-03-05 NOTE — Assessment & Plan Note (Signed)
He needs to use CPAP

## 2012-03-05 NOTE — Patient Instructions (Signed)
Postprocedure instructions :    A Band-Aid should be left on for 12 hours. Injection therapy is not a cure itself. It is used in conjunction with other modalities. You can use nonsteroidal anti-inflammatories like ibuprofen , hot and cold compresses. Rest is recommended in the next 24 hours. You need to report immediately  if fever, chills or any signs of infection develop.  Naprelan 750 mg one daily

## 2012-03-05 NOTE — Progress Notes (Signed)
Patient ID: Ronnie Curtis, male   DOB: July 02, 1951, 61 y.o.   MRN: [id number]  Subjective:    Patient ID: Ronnie Curtis, male    DOB: [birth year], 61 y.o.   MRN: [id number]  Knee Pain    C/o R knee pain since sat - it popped at work when he was walking; severe pain, limping  The patient presents for a follow-up of  chronic LBP, chronic hypogonadism, HTN previously controlled with medicines  Wt Readings from Last 3 Encounters:  03/05/12 309 lb 12 oz (140.502 kg)  10/15/11 314 lb (142.429 kg)  08/28/11 305 lb 4 oz (138.46 kg)   BP Readings from Last 3 Encounters:  03/05/12 142/90  10/15/11 140/90  08/28/11 130/86       Review of Systems  Constitutional: Negative for appetite change, fatigue and unexpected weight change.  HENT: Negative for nosebleeds, congestion, sore throat, sneezing, trouble swallowing and neck pain.   Eyes: Negative for itching and visual disturbance.  Respiratory: Negative for cough.   Cardiovascular: Negative for chest pain, palpitations and leg swelling.  Gastrointestinal: Negative for nausea, diarrhea, blood in stool and abdominal distention.  Genitourinary: Negative for frequency and hematuria.  Musculoskeletal: Positive for back pain. Negative for joint swelling and gait problem.  Skin: Negative for rash.  Neurological: Negative for dizziness, tremors, speech difficulty and weakness.  Psychiatric/Behavioral: Negative for suicidal ideas, sleep disturbance, dysphoric mood and agitation. The patient is not nervous/anxious.        Objective:   Physical Exam  Constitutional: He is oriented to person, place, and time. He appears well-developed.       Very obese  HENT:  Mouth/Throat: Oropharynx is clear and moist.  Eyes: Conjunctivae are normal. Pupils are equal, round, and reactive to light.  Neck: Normal range of motion. No JVD present. No thyromegaly present.  Cardiovascular: Normal rate, regular rhythm, normal heart sounds and intact distal  pulses.  Exam reveals no gallop and no friction rub.   No murmur heard. Pulmonary/Chest: Effort normal and breath sounds normal. No respiratory distress. He has no wheezes. He has no rales. He exhibits no tenderness.  Abdominal: Soft. Bowel sounds are normal. He exhibits no distension and no mass. There is no tenderness. There is no rebound and no guarding.  Musculoskeletal: Normal range of motion. He exhibits tenderness (LS is tender). He exhibits no edema.       R knee is tender w/ROM  Lymphadenopathy:    He has no cervical adenopathy.  Neurological: He is alert and oriented to person, place, and time. He has normal reflexes. No cranial nerve deficit. He exhibits normal muscle tone. Coordination normal.  Skin: Skin is warm and dry. No rash noted.  Psychiatric: He has a normal mood and affect. His behavior is normal. Judgment and thought content normal.     Lab Results  Component Value Date   WBC 4.4* 02/06/2011   HGB 13.1 02/06/2011   HCT 38.1* 02/06/2011   PLT 244.0 02/06/2011   GLUCOSE 113* 02/06/2011   CHOL 171 02/06/2011   TRIG 88.0 02/06/2011   HDL 49.20 02/06/2011   LDLDIRECT 70.7 01/19/2009   LDLCALC 104* 02/06/2011   ALT 24 02/06/2011   AST 25 02/06/2011   NA 140 02/06/2011   K 4.9 02/06/2011   CL 104 02/06/2011   CREATININE 0.8 02/06/2011   BUN 14 02/06/2011   CO2 32 02/06/2011   TSH 1.63 02/06/2011   PSA 0.44 02/06/2011   HGBA1C 5.9 12/19/2010  Procedure Note :     Procedure :Joint Injection, R  knee   Indication:  Joint osteoarthritis with refractory  chronic pain.   Risks including unsuccessful procedure , bleeding, infection, bruising, skin atrophy and others were explained to the patient in detail as well as the benefits. Informed consent was obtained and signed.   Tthe patient was placed in a comfortable position. Lateral approach was used. Skin was prepped with Betadine and alcohol  and anesthetized with 2 cc of 2% lidocaine and epinephrine, using a 25-gauge 1-1/2 inch  needle. Then, a 5 cc syringe with a 2 inch long 22-gauge needle was used for a joint injection.. The needle was advanced  Into the knee joint cavity. I aspirated a small amount of intra-articular fluid to confirm correct placement of the needle and injected the joint with 5 mL of 2% lidocaine and 40 mg of Depo-Medrol .  Band-Aid was applied.   Tolerated well. Complications: None. Good pain relief following the procedure.   Postprocedure instructions :    A Band-Aid should be left on for 12 hours. Injection therapy is not a cure itself. It is used in conjunction with other modalities. You can use nonsteroidal anti-inflammatories like ibuprofen , hot and cold compresses. Rest is recommended in the next 24 hours. You need to report immediately  if fever, chills or any signs of infection develop.      Assessment & Plan:

## 2012-03-05 NOTE — Assessment & Plan Note (Signed)
Continue with current prescription therapy as reflected on the Med list.  

## 2012-03-05 NOTE — Telephone Encounter (Signed)
Please advise on quantity and fill date. Thanks!

## 2012-03-05 NOTE — Assessment & Plan Note (Signed)
Much better 

## 2012-03-05 NOTE — Assessment & Plan Note (Signed)
He asked me to inject it - will do today

## 2012-03-05 NOTE — Telephone Encounter (Signed)
Addended by: Merrilyn Puma on: 03/05/2012 09:08 AM   Modules accepted: Orders

## 2012-03-18 ENCOUNTER — Ambulatory Visit: Payer: Managed Care, Other (non HMO) | Admitting: Internal Medicine

## 2012-03-31 ENCOUNTER — Telehealth: Payer: Self-pay

## 2012-03-31 MED ORDER — OXYCODONE HCL 15 MG PO TABS: 15.0000 mg | ORAL_TABLET | Freq: Four times a day (QID) | ORAL | Status: DC

## 2012-03-31 NOTE — Telephone Encounter (Signed)
Pt called requesting refill of Oxycodone. 

## 2012-03-31 NOTE — Telephone Encounter (Signed)
Rx printed and placed on MD's desk for signature.  

## 2012-03-31 NOTE — Telephone Encounter (Signed)
Ok if time Thx 

## 2012-04-01 NOTE — Telephone Encounter (Signed)
Pt informed, Rx in cabinet for pt pick up  

## 2012-04-02 ENCOUNTER — Other Ambulatory Visit (INDEPENDENT_AMBULATORY_CARE_PROVIDER_SITE_OTHER): Payer: Managed Care, Other (non HMO)

## 2012-04-02 DIAGNOSIS — Z0000 Encounter for general adult medical examination without abnormal findings: Secondary | ICD-10-CM

## 2012-04-02 DIAGNOSIS — Z0389 Encounter for observation for other suspected diseases and conditions ruled out: Secondary | ICD-10-CM

## 2012-04-02 DIAGNOSIS — E291 Testicular hypofunction: Secondary | ICD-10-CM

## 2012-04-02 LAB — URINALYSIS, ROUTINE W REFLEX MICROSCOPIC
Leukocytes, UA: NEGATIVE
Nitrite: NEGATIVE
Specific Gravity, Urine: <=1.005 (ref 1.000–1.030)
Urobilinogen, UA: 0.2 (ref 0.0–1.0)
pH: 6 (ref 5.0–8.0)

## 2012-04-02 LAB — BASIC METABOLIC PANEL
BUN: 19 mg/dL (ref 6–23)
Calcium: 9.3 mg/dL (ref 8.4–10.5)
GFR: 77.98 mL/min (ref >60.00)
Glucose, Bld: 106 mg/dL — ABNORMAL HIGH (ref 70–99)
Potassium: 5.1 mEq/L (ref 3.5–5.1)

## 2012-04-02 LAB — CBC WITH DIFFERENTIAL/PLATELET
Basophils Absolute: 0.1 10*3/uL (ref 0.0–0.1)
Basophils Relative: 1 % (ref 0.0–3.0)
Eosinophils Absolute: 0.3 10*3/uL (ref 0.0–0.7)
Lymphocytes Relative: 30.8 % (ref 12.0–46.0)
MCHC: 33.6 g/dL (ref 30.0–36.0)
MCV: 95.5 fl (ref 78.0–100.0)
Monocytes Absolute: 0.5 10*3/uL (ref 0.1–1.0)
Neutrophils Relative %: 51.7 % (ref 43.0–77.0)
Platelets: 223 10*3/uL (ref 150.0–400.0)
RBC: 4.12 Mil/uL — ABNORMAL LOW (ref 4.22–5.81)

## 2012-04-02 LAB — PSA: PSA: 0.78 ng/mL (ref 0.10–4.00)

## 2012-04-02 LAB — LIPID PANEL
Cholesterol: 172 mg/dL (ref 0–200)
HDL: 42.7 mg/dL (ref >39.00)
Triglycerides: 129 mg/dL (ref 0.0–149.0)

## 2012-04-02 LAB — HEPATIC FUNCTION PANEL
AST: 27 U/L (ref 0–37)
Albumin: 3.7 g/dL (ref 3.5–5.2)
Total Bilirubin: 0.7 mg/dL (ref 0.3–1.2)

## 2012-04-03 ENCOUNTER — Encounter: Payer: Self-pay | Admitting: Internal Medicine

## 2012-04-03 ENCOUNTER — Encounter: Payer: Self-pay | Admitting: Endocrinology

## 2012-04-03 LAB — TESTOSTERONE: Testosterone: 373.57 ng/dL (ref 350.00–890.00)

## 2012-04-04 ENCOUNTER — Telehealth: Payer: Self-pay | Admitting: *Deleted

## 2012-04-04 ENCOUNTER — Encounter: Payer: Self-pay | Admitting: Internal Medicine

## 2012-04-04 NOTE — Telephone Encounter (Signed)
Called pt to inform of lab results, left message for pt to callback office (letter also mailed to pt). 

## 2012-04-04 NOTE — Telephone Encounter (Signed)
Pt informed of lab results. Pt wants to know when you want him to follow up again on testosterone.

## 2012-04-04 NOTE — Telephone Encounter (Signed)
October of this year

## 2012-04-08 ENCOUNTER — Encounter: Payer: Self-pay | Admitting: Internal Medicine

## 2012-04-08 NOTE — Telephone Encounter (Signed)
Left message informing pt to F/U on testosterone in October.

## 2012-04-17 ENCOUNTER — Ambulatory Visit (AMBULATORY_SURGERY_CENTER): Payer: Managed Care, Other (non HMO) | Admitting: *Deleted

## 2012-04-17 ENCOUNTER — Ambulatory Visit (INDEPENDENT_AMBULATORY_CARE_PROVIDER_SITE_OTHER): Payer: Managed Care, Other (non HMO) | Admitting: Internal Medicine

## 2012-04-17 ENCOUNTER — Encounter: Payer: Self-pay | Admitting: Internal Medicine

## 2012-04-17 VITALS — BP 130/72 | HR 76 | Temp 98.1°F | Resp 16 | Ht 67.0 in | Wt 307.0 lb

## 2012-04-17 VITALS — Ht 67.0 in | Wt 300.0 lb

## 2012-04-17 DIAGNOSIS — E119 Type 2 diabetes mellitus without complications: Secondary | ICD-10-CM

## 2012-04-17 DIAGNOSIS — Z1211 Encounter for screening for malignant neoplasm of colon: Secondary | ICD-10-CM

## 2012-04-17 DIAGNOSIS — E291 Testicular hypofunction: Secondary | ICD-10-CM

## 2012-04-17 DIAGNOSIS — M545 Low back pain, unspecified: Secondary | ICD-10-CM

## 2012-04-17 DIAGNOSIS — Z0000 Encounter for general adult medical examination without abnormal findings: Secondary | ICD-10-CM

## 2012-04-17 DIAGNOSIS — R252 Cramp and spasm: Secondary | ICD-10-CM

## 2012-04-17 DIAGNOSIS — I10 Essential (primary) hypertension: Secondary | ICD-10-CM

## 2012-04-17 MED ORDER — OXYCODONE HCL 15 MG PO TABS: 15.0000 mg | ORAL_TABLET | Freq: Four times a day (QID) | ORAL | Status: DC

## 2012-04-17 MED ORDER — PEG-KCL-NACL-NASULF-NA ASC-C 100 G PO SOLR: ORAL | Status: DC

## 2012-04-17 NOTE — Assessment & Plan Note (Signed)
Refractory - better Lap Band was discussed

## 2012-04-17 NOTE — Assessment & Plan Note (Addendum)
We discussed age appropriate health related issues, including available/recomended screening tests and vaccinations. We discussed a need for adhering to healthy diet and exercise. Labs/EKG were reviewed/ordered. All questions were answered. Colon pending Zostavax recomended

## 2012-04-17 NOTE — Progress Notes (Signed)
Subjective:    Patient ID: Ronnie Curtis, male    DOB: 1950-12-10, 61 y.o.   MRN: [id number]  HPI The patient is here for a wellness exam. The patient has been doing well overall without major physical or psychological issues going on lately. C/o leg cramps The patient presents for a follow-up of  chronic LBP, chronic hypogonadism, HTN previously controlled with medicines  Wt Readings from Last 3 Encounters:  04/17/12 307 lb (139.254 kg)  03/05/12 309 lb 12 oz (140.502 kg)  10/15/11 314 lb (142.429 kg)   BP Readings from Last 3 Encounters:  04/17/12 130/72  03/05/12 142/90  10/15/11 140/90       Review of Systems  Constitutional: Negative for appetite change, fatigue and unexpected weight change.  HENT: Negative for nosebleeds, congestion, sore throat, sneezing, trouble swallowing and neck pain.   Eyes: Negative for itching and visual disturbance.  Respiratory: Negative for cough.   Cardiovascular: Negative for chest pain, palpitations and leg swelling.  Gastrointestinal: Negative for nausea, diarrhea, blood in stool and abdominal distention.  Genitourinary: Negative for frequency and hematuria.  Musculoskeletal: Positive for back pain. Negative for joint swelling and gait problem.  Skin: Negative for rash.  Neurological: Negative for dizziness, tremors, speech difficulty and weakness.  Psychiatric/Behavioral: Negative for suicidal ideas, sleep disturbance, dysphoric mood and agitation. The patient is not nervous/anxious.        Objective:   Physical Exam  Constitutional: He is oriented to person, place, and time. He appears well-developed.       Very obese  HENT:  Mouth/Throat: Oropharynx is clear and moist.  Eyes: Conjunctivae are normal. Pupils are equal, round, and reactive to light.  Neck: Normal range of motion. No JVD present. No thyromegaly present.  Cardiovascular: Normal rate, regular rhythm, normal heart sounds and intact distal pulses.  Exam reveals no  gallop and no friction rub.   No murmur heard. Pulmonary/Chest: Effort normal and breath sounds normal. No respiratory distress. He has no wheezes. He has no rales. He exhibits no tenderness.  Abdominal: Soft. Bowel sounds are normal. He exhibits no distension and no mass. There is no tenderness. There is no rebound and no guarding.  Musculoskeletal: Normal range of motion. He exhibits tenderness (LS is tender). He exhibits no edema.       R knee is not tender w/ROM  Lymphadenopathy:    He has no cervical adenopathy.  Neurological: He is alert and oriented to person, place, and time. He has normal reflexes. No cranial nerve deficit. He exhibits normal muscle tone. Coordination normal.  Skin: Skin is warm and dry. No rash noted.  Psychiatric: He has a normal mood and affect. His behavior is normal. Judgment and thought content normal.   Rect/prost per Dr Leone Payor pending  Lab Results  Component Value Date   WBC 5.2 04/02/2012   HGB 13.2 04/02/2012   HCT 39.3 04/02/2012   PLT 223.0 04/02/2012   GLUCOSE 106* 04/02/2012   CHOL 172 04/02/2012   TRIG 129.0 04/02/2012   HDL 42.70 04/02/2012   LDLDIRECT 70.7 01/19/2009   LDLCALC 104* 04/02/2012   ALT 27 04/02/2012   AST 27 04/02/2012   NA 139 04/02/2012   K 5.1 04/02/2012   CL 102 04/02/2012   CREATININE 1.0 04/02/2012   BUN 19 04/02/2012   CO2 31 04/02/2012   TSH 3.18 04/02/2012   PSA 0.78 04/02/2012   HGBA1C 5.9 12/19/2010         Assessment & Plan:

## 2012-04-17 NOTE — Assessment & Plan Note (Signed)
Doing good  

## 2012-04-17 NOTE — Assessment & Plan Note (Signed)
Continue with current prescription therapy as reflected on the Med list.  

## 2012-04-17 NOTE — Assessment & Plan Note (Signed)
Could be due to Maxzide - hold for a few days "Amish mix" to try

## 2012-04-17 NOTE — Assessment & Plan Note (Signed)
Renewed Rx 

## 2012-04-18 ENCOUNTER — Encounter: Payer: Managed Care, Other (non HMO) | Admitting: Internal Medicine

## 2012-04-25 ENCOUNTER — Telehealth: Payer: Self-pay

## 2012-04-25 NOTE — Telephone Encounter (Signed)
[facility] Triage Call Report Triage Record Num: [id number] Operator: Frederico Hamman Patient Name: Ronnie Curtis Call Date & Time: 04/25/2012 2:03:52PM Patient Phone: [phone] PCP: Sonda Primes Patient Gender: Male PCP Fax : [phone] Patient DOB: 1950-11-17 Practice Name: [facility] Reason for Call: Caller: Joas/Patient; PCP: Sonda Primes; CB#: [phone]; ; ; Call regarding Patient Has Bronchitis With Cough. THE PATIENT REFUSED 911; Dream calling on emergent line and states he has bronchitis.States he had a head cold that has moved to his chest. Onset of intermittent productive cough with white to clear sputum and sore throat on 04/24/12. Slight shortness of breath with cough.Per cough protocol has see provider within 24 hr disposition due to gradual onset of cough when lying down and relieved after being in a sitting or standing position. Requesting a Zpack and Tussin be called in to [facility] [phone]. Unable to access EPIC at present. office note Protocol(s) Used: Office Note Recommended Outcome per Protocol: Information Noted and Sent to Office Reason for Outcome: Caller information to office Care Advice: ~ 04/25/2012 2:25:40PM Page 1 of 1 [email or web address]

## 2012-04-28 ENCOUNTER — Telehealth: Payer: Self-pay | Admitting: Internal Medicine

## 2012-04-28 NOTE — Telephone Encounter (Signed)
[facility] 3 pages from [facility], sent to Dr. Posey Rea. 04/28/12/SD.

## 2012-05-14 ENCOUNTER — Encounter: Payer: Self-pay | Admitting: Internal Medicine

## 2012-05-14 ENCOUNTER — Ambulatory Visit (INDEPENDENT_AMBULATORY_CARE_PROVIDER_SITE_OTHER): Payer: Managed Care, Other (non HMO) | Admitting: Internal Medicine

## 2012-05-14 DIAGNOSIS — M545 Low back pain: Secondary | ICD-10-CM

## 2012-05-14 DIAGNOSIS — Z23 Encounter for immunization: Secondary | ICD-10-CM

## 2012-05-14 DIAGNOSIS — E119 Type 2 diabetes mellitus without complications: Secondary | ICD-10-CM

## 2012-05-14 DIAGNOSIS — I10 Essential (primary) hypertension: Secondary | ICD-10-CM

## 2012-05-14 DIAGNOSIS — Z2911 Encounter for prophylactic immunotherapy for respiratory syncytial virus (RSV): Secondary | ICD-10-CM

## 2012-05-14 DIAGNOSIS — F329 Major depressive disorder, single episode, unspecified: Secondary | ICD-10-CM

## 2012-05-14 MED ORDER — SPIRONOLACTONE 50 MG PO TABS: 50.0000 mg | ORAL_TABLET | Freq: Every day | ORAL | Status: DC

## 2012-05-14 MED ORDER — INDAPAMIDE 2.5 MG PO TABS: 2.5000 mg | ORAL_TABLET | ORAL | Status: DC

## 2012-05-14 MED ORDER — OXYCODONE HCL 15 MG PO TABS: 15.0000 mg | ORAL_TABLET | Freq: Four times a day (QID) | ORAL | Status: DC

## 2012-05-14 NOTE — Assessment & Plan Note (Signed)
Surgery ref Dr Stephens Shire cons

## 2012-05-14 NOTE — Progress Notes (Signed)
Patient ID: Ronnie Curtis, male   DOB: 10-14-1951, 61 y.o.   MRN: [id number]  Subjective:    Patient ID: Ronnie Curtis, male    DOB: Nov 09, 1951, 61 y.o.   MRN: [id number]  HPI  The patient presents for a follow-up of  chronic LBP, chronic hypogonadism, HTN previously controlled with medicines. He stopped Maxzide due to cramps - resolved. He lost his job. He would like to get lap band done...  Wt Readings from Last 3 Encounters:  05/14/12 314 lb (142.429 kg)  04/17/12 300 lb (136.079 kg)  04/17/12 307 lb (139.254 kg)   BP Readings from Last 3 Encounters:  05/14/12 160/98  04/17/12 130/72  03/05/12 142/90       Review of Systems  Constitutional: Negative for appetite change, fatigue and unexpected weight change.  HENT: Negative for nosebleeds, congestion, sore throat, sneezing, trouble swallowing and neck pain.   Eyes: Negative for itching and visual disturbance.  Respiratory: Negative for cough.   Cardiovascular: Negative for leg swelling.  Gastrointestinal: Negative for diarrhea, blood in stool and abdominal distention.  Genitourinary: Negative for frequency and hematuria.  Musculoskeletal: Positive for back pain. Negative for joint swelling and gait problem.  Skin: Negative for rash.  Neurological: Negative for tremors, speech difficulty and weakness.  Psychiatric/Behavioral: Negative for disturbed wake/sleep cycle, dysphoric mood and agitation.       Objective:   Physical Exam  Constitutional: He is oriented to person, place, and time. He appears well-developed.       Very obese  HENT:  Mouth/Throat: Oropharynx is clear and moist.  Eyes: Conjunctivae are normal. Pupils are equal, round, and reactive to light.  Neck: Normal range of motion. No JVD present. No thyromegaly present.  Cardiovascular: Normal rate, regular rhythm, normal heart sounds and intact distal pulses.  Exam reveals no gallop and no friction rub.   No murmur heard. Pulmonary/Chest: Effort  normal and breath sounds normal. No respiratory distress. He has no wheezes. He has no rales. He exhibits no tenderness.  Abdominal: Soft. Bowel sounds are normal. He exhibits no distension and no mass. There is no tenderness. There is no rebound and no guarding.  Musculoskeletal: Normal range of motion. He exhibits tenderness (LS is tender). He exhibits no edema.       R knee is not tender w/ROM  Lymphadenopathy:    He has no cervical adenopathy.  Neurological: He is alert and oriented to person, place, and time. He has normal reflexes. No cranial nerve deficit. He exhibits normal muscle tone. Coordination normal.  Skin: Skin is warm and dry. No rash noted.  Psychiatric: He has a normal mood and affect. His behavior is normal. Judgment and thought content normal.     Lab Results  Component Value Date   WBC 5.2 04/02/2012   HGB 13.2 04/02/2012   HCT 39.3 04/02/2012   PLT 223.0 04/02/2012   GLUCOSE 106* 04/02/2012   CHOL 172 04/02/2012   TRIG 129.0 04/02/2012   HDL 42.70 04/02/2012   LDLDIRECT 70.7 01/19/2009   LDLCALC 104* 04/02/2012   ALT 27 04/02/2012   AST 27 04/02/2012   NA 139 04/02/2012   K 5.1 04/02/2012   CL 102 04/02/2012   CREATININE 1.0 04/02/2012   BUN 19 04/02/2012   CO2 31 04/02/2012   TSH 3.18 04/02/2012   PSA 0.78 04/02/2012   HGBA1C 5.9 12/19/2010         Assessment & Plan:

## 2012-05-14 NOTE — Assessment & Plan Note (Signed)
Continue with current prescription therapy as reflected on the Med list.  

## 2012-05-14 NOTE — Assessment & Plan Note (Signed)
He stopped Maxzide due to cramps See Rx

## 2012-05-14 NOTE — Assessment & Plan Note (Signed)
Labs

## 2012-05-14 NOTE — Assessment & Plan Note (Signed)
Not on Rx 

## 2012-05-16 ENCOUNTER — Telehealth: Payer: Self-pay

## 2012-05-16 DIAGNOSIS — Z1211 Encounter for screening for malignant neoplasm of colon: Secondary | ICD-10-CM

## 2012-05-16 NOTE — Telephone Encounter (Signed)
Message copied by Annett Fabian on Fri May 16, 2012  9:55 AM ------      Message from: Iva Boop      Created: Fri May 16, 2012  8:48 AM      Regarding: RE: Pt scheduled for 7/12       [facility] - lets move him to hospital on same day if possible - looks like I could do the case early at hospital and start at 9 in [facility] or do him midday at hospital - Thanks            Explain to patient its for safety just in case  due to sleep apnea and size      ----- Message -----         From: Marcy Salvo, RN         Sent: 05/14/2012   5:42 PM           To: Iva Boop, MD      Subject: Pt scheduled for 7/12                                    Dr Leone Payor,            Cathlyn Parsons has asked that you review this patients chart.   He is supposed to have a procedure with you on 7/12.  He has a BMI of 49 and has a hx of obstructive sleep apnea.  He wants to know if you think this pt should be scoped at the hospital.            Thanks,            Laverna Peace RN

## 2012-05-16 NOTE — Telephone Encounter (Signed)
Left message for patient to call back  

## 2012-05-19 NOTE — Telephone Encounter (Signed)
Patient is rescheduled to [facility] for 05/23/12 11:30.  I have reviewed his instructions with him.  He verbalized understanding.

## 2012-05-22 ENCOUNTER — Telehealth: Payer: Self-pay | Admitting: Internal Medicine

## 2012-05-22 NOTE — Telephone Encounter (Signed)
Patient called again and is now vomiting Miralax prep.  He does not wish to proceed at this point, even after I offered antiemetics.  This is understandable. Office will call patient tomorrow. Dr. Leone Payor can decide how to proceed.

## 2012-05-22 NOTE — Telephone Encounter (Signed)
Vomited both attempts at [employer].   Has not had a BM. Does not think much prep was retained.  I have asked he stop all Moviprep and switch to Miralax prep.  Instructions given, 64 oz Gatorade with 255 g Miralax.  He will drink half tonight and half in the am at his scheduled time.  Questions answered and he is satisfied.  Declined antiemetics, such as zofran or reglan. He will call if he has other issues.

## 2012-05-23 ENCOUNTER — Encounter (HOSPITAL_COMMUNITY): Admission: RE | Payer: Self-pay | Source: Ambulatory Visit

## 2012-05-23 ENCOUNTER — Ambulatory Visit (HOSPITAL_COMMUNITY)
Admission: RE | Admit: 2012-05-23 | Payer: Managed Care, Other (non HMO) | Source: Ambulatory Visit | Admitting: Internal Medicine

## 2012-05-23 ENCOUNTER — Encounter: Payer: Managed Care, Other (non HMO) | Admitting: Internal Medicine

## 2012-05-23 SURGERY — COLONOSCOPY
Anesthesia: Moderate Sedation

## 2012-05-23 NOTE — Telephone Encounter (Signed)
Was hospital case Need to cancel  I can see him in office to review and discuss alternative preps

## 2012-05-23 NOTE — Telephone Encounter (Signed)
I have cancelled the appt at the hospital. I have asked him to call back to schedule an office visit

## 2012-06-10 ENCOUNTER — Encounter: Payer: Self-pay | Admitting: *Deleted

## 2012-06-10 ENCOUNTER — Encounter: Payer: Managed Care, Other (non HMO) | Attending: Internal Medicine | Admitting: *Deleted

## 2012-06-10 ENCOUNTER — Telehealth: Payer: Self-pay

## 2012-06-10 DIAGNOSIS — R7303 Prediabetes: Secondary | ICD-10-CM

## 2012-06-10 DIAGNOSIS — Z713 Dietary counseling and surveillance: Secondary | ICD-10-CM | POA: Insufficient documentation

## 2012-06-10 NOTE — Telephone Encounter (Signed)
Danita called stating that diagnosis on referral is Diabetes Type II, obesity and hypertension. Pt denies having diabetes and there are no labs in  EPIC to support same per Danita. Danita is requesting MD clarification on pt's diagnosis and referral.

## 2012-06-10 NOTE — Progress Notes (Signed)
  Medical Nutrition Therapy:  Appt start time: 0800 end time:  0900.   Assessment:  Primary concerns today: obesity.  Wants bariatric surgery; needs to loose weight first.  Referred for HTN and DM. Patient denies DM diagnosis.  No recent lab results available for HgA1C or fasting glucose...  MEDICATIONS: see list   DIETARY INTAKE:  Usual eating pattern includes 2-3 meals and 1-2(large) snacks per day.  Everyday foods include fruits, vegetables, meats, starches.  Avoided foods include fast food.    24-hr recall:  B ( AM): sometimes skips: may have cottage cheese and cantaloupe; may have bacon, eggs, hash browns and toast on weekends; tomato sandwich on toast and butter; may have fried eggs sandwich with cheese, lettuce, and mayo.  Coffee black Snk ( AM): none  L ( PM): ham and cheese sandwich; leftovers; sushi sometimes- avoid fast food Snk ( PM): peach or plum; occasionally Nabs or small bag chips D ( PM): go out to eat most of the time.  May cook at home- spaghetti with garlic bread; hamburgers; pork chops; steaks; salmon; tilapia; meatloaf with mashed potatoes and vegetable (corn peas carrots) Snk ( PM): chips and dip; ice cream; popcorn; leftovers; candies Beverages: water, sometimes soda; sometimes vegetable juice; coffee black  Usual physical activity: none  Estimated energy needs: 1600 calories 180 g carbohydrates 120 g protein 44 g fat  Progress Towards Goal(s):  In progress.   Nutritional Diagnosis:  [place]-3.3 Overweight/obesity As related to large portions, late-night excessive snacking, and physical inactivity.  As evidenced by BMI of 50.2.    Intervention:  Nutrition counseling provided.  Dolton states he knows what he needs to do for weight loss and that he's done it before.  I applauded his past successes and encouraged him to utilize some of the same methods this time.  We discussed limiting the late-night snacking.  He is an Surveyor, quantity. Discussed activities he  could do instead of snacking, like taking a walk or doing a crossword puzzle.  Encouraged increasing physical activity through walking and chair exercises.  Discussed MyPlate for meal planning (more vegetables, smaller portion of meat and starch).  Discussed portion control and limiting dietary fats.  Encouraged baked, broiled, grilled, not fried food and using small amounts of added fatty condiments (buttter, mayo, etc).  Encouraged 3 meals a day- no meal skipping.  Handouts given during visit include:  Chair exercises  Emotional eating  Monitoring/Evaluation:  Dietary intake, exercise, and body weight in 1 month(s).

## 2012-06-10 NOTE — Patient Instructions (Signed)
Goals:  Eat 3 meals/day, Avoid meal skipping   Increase protein rich foods  Follow "Plate Method" for portion control  Limit carbohydrate1-2 servings/meal   Choose more whole grains, lean protein, low-fat dairy, and fruits/non-starchy vegetables.   Aim for >30 min of physical activity daily  Limit sugar-sweetened beverages and concentrated sweets   

## 2012-06-10 NOTE — Telephone Encounter (Signed)
Use pre-diabetis or diet controlled DM2 for dx Thx

## 2012-06-11 DIAGNOSIS — R7303 Prediabetes: Secondary | ICD-10-CM | POA: Insufficient documentation

## 2012-06-11 NOTE — Telephone Encounter (Signed)
Dx updated and added to referral. Danita aware.

## 2012-06-11 NOTE — Telephone Encounter (Signed)
Left message on machine for Danita to return my call

## 2012-06-18 ENCOUNTER — Ambulatory Visit (INDEPENDENT_AMBULATORY_CARE_PROVIDER_SITE_OTHER): Payer: Managed Care, Other (non HMO) | Admitting: Internal Medicine

## 2012-06-18 ENCOUNTER — Other Ambulatory Visit (INDEPENDENT_AMBULATORY_CARE_PROVIDER_SITE_OTHER): Payer: Managed Care, Other (non HMO)

## 2012-06-18 ENCOUNTER — Encounter: Payer: Self-pay | Admitting: Internal Medicine

## 2012-06-18 VITALS — BP 130/88 | HR 80 | Temp 98.5°F | Resp 16 | Wt 310.4 lb

## 2012-06-18 DIAGNOSIS — E119 Type 2 diabetes mellitus without complications: Secondary | ICD-10-CM

## 2012-06-18 DIAGNOSIS — M545 Low back pain: Secondary | ICD-10-CM

## 2012-06-18 DIAGNOSIS — I10 Essential (primary) hypertension: Secondary | ICD-10-CM

## 2012-06-18 DIAGNOSIS — E291 Testicular hypofunction: Secondary | ICD-10-CM

## 2012-06-18 DIAGNOSIS — G4733 Obstructive sleep apnea (adult) (pediatric): Secondary | ICD-10-CM

## 2012-06-18 DIAGNOSIS — F329 Major depressive disorder, single episode, unspecified: Secondary | ICD-10-CM

## 2012-06-18 LAB — TSH: TSH: 2.02 u[IU]/mL (ref 0.35–5.50)

## 2012-06-18 LAB — HEMOGLOBIN A1C: Hgb A1c MFr Bld: 5.9 % (ref 4.6–6.5)

## 2012-06-18 LAB — BASIC METABOLIC PANEL
Calcium: 9.6 mg/dL (ref 8.4–10.5)
GFR: 79.7 mL/min (ref >60.00)
Potassium: 4.4 mEq/L (ref 3.5–5.1)
Sodium: 141 mEq/L (ref 135–145)

## 2012-06-18 MED ORDER — OXYCODONE HCL 15 MG PO TABS: 15.0000 mg | ORAL_TABLET | Freq: Four times a day (QID) | ORAL | Status: DC

## 2012-06-18 MED ORDER — CLOMIPHENE CITRATE 50 MG PO TABS: ORAL_TABLET | ORAL | Status: DC

## 2012-06-18 NOTE — Assessment & Plan Note (Signed)
Unable to tol. CPAP

## 2012-06-18 NOTE — Progress Notes (Signed)
  Subjective:    Patient ID: Ronnie Curtis, male    DOB: 10-31-1951, 61 y.o.   MRN: [id number]  HPI  The patient presents for a follow-up of  chronic LBP, chronic hypogonadism, HTN previously controlled with medicines. He stopped Maxzide due to cramps - resolved. He lost his job. He is preparing to get lap band done...  Wt Readings from Last 3 Encounters:  06/18/12 310 lb 6 oz (140.785 kg)  06/10/12 310 lb 12.8 oz (140.978 kg)  05/14/12 314 lb (142.429 kg)   BP Readings from Last 3 Encounters:  06/18/12 130/88  05/14/12 160/98  04/17/12 130/72       Review of Systems  Constitutional: Negative for appetite change, fatigue and unexpected weight change.  HENT: Negative for nosebleeds, congestion, sore throat, sneezing, trouble swallowing and neck pain.   Eyes: Negative for itching and visual disturbance.  Respiratory: Negative for cough.   Cardiovascular: Negative for leg swelling.  Gastrointestinal: Negative for diarrhea, blood in stool and abdominal distention.  Genitourinary: Negative for frequency and hematuria.  Musculoskeletal: Positive for back pain. Negative for joint swelling and gait problem.  Skin: Negative for rash.  Neurological: Negative for tremors, speech difficulty and weakness.  Psychiatric/Behavioral: Negative for disturbed wake/sleep cycle, dysphoric mood and agitation.       Objective:   Physical Exam  Constitutional: He is oriented to person, place, and time. He appears well-developed.       Very obese  HENT:  Mouth/Throat: Oropharynx is clear and moist.  Eyes: Conjunctivae are normal. Pupils are equal, round, and reactive to light.  Neck: Normal range of motion. No JVD present. No thyromegaly present.  Cardiovascular: Normal rate, regular rhythm, normal heart sounds and intact distal pulses.  Exam reveals no gallop and no friction rub.   No murmur heard. Pulmonary/Chest: Effort normal and breath sounds normal. No respiratory distress. He has no  wheezes. He has no rales. He exhibits no tenderness.  Abdominal: Soft. Bowel sounds are normal. He exhibits no distension and no mass. There is no tenderness. There is no rebound and no guarding.  Musculoskeletal: Normal range of motion. He exhibits tenderness (LS is tender). He exhibits no edema.       R knee is not tender w/ROM  Lymphadenopathy:    He has no cervical adenopathy.  Neurological: He is alert and oriented to person, place, and time. He has normal reflexes. No cranial nerve deficit. He exhibits normal muscle tone. Coordination normal.  Skin: Skin is warm and dry. No rash noted.  Psychiatric: He has a normal mood and affect. His behavior is normal. Judgment and thought content normal.     Lab Results  Component Value Date   WBC 5.2 04/02/2012   HGB 13.2 04/02/2012   HCT 39.3 04/02/2012   PLT 223.0 04/02/2012   GLUCOSE 106* 04/02/2012   CHOL 172 04/02/2012   TRIG 129.0 04/02/2012   HDL 42.70 04/02/2012   LDLDIRECT 70.7 01/19/2009   LDLCALC 104* 04/02/2012   ALT 27 04/02/2012   AST 27 04/02/2012   NA 139 04/02/2012   K 5.1 04/02/2012   CL 102 04/02/2012   CREATININE 1.0 04/02/2012   BUN 19 04/02/2012   CO2 31 04/02/2012   TSH 3.18 04/02/2012   PSA 0.78 04/02/2012   HGBA1C 5.9 12/19/2010         Assessment & Plan:

## 2012-06-18 NOTE — Assessment & Plan Note (Signed)
Refractory Lap Band - we are preparing for surgery

## 2012-06-18 NOTE — Assessment & Plan Note (Signed)
Continue with current prescription therapy as reflected on the Med list.  

## 2012-06-18 NOTE — Assessment & Plan Note (Signed)
On Clomid 

## 2012-06-18 NOTE — Assessment & Plan Note (Addendum)
On diet Lap Band is planned

## 2012-06-30 ENCOUNTER — Encounter: Payer: Self-pay | Admitting: Internal Medicine

## 2012-06-30 ENCOUNTER — Ambulatory Visit (INDEPENDENT_AMBULATORY_CARE_PROVIDER_SITE_OTHER): Payer: Managed Care, Other (non HMO) | Admitting: Internal Medicine

## 2012-06-30 VITALS — BP 136/80 | HR 74 | Ht 66.0 in | Wt 317.0 lb

## 2012-06-30 DIAGNOSIS — Z860100 Personal history of colon polyps, unspecified: Secondary | ICD-10-CM | POA: Insufficient documentation

## 2012-06-30 DIAGNOSIS — R112 Nausea with vomiting, unspecified: Secondary | ICD-10-CM

## 2012-06-30 DIAGNOSIS — Z1211 Encounter for screening for malignant neoplasm of colon: Secondary | ICD-10-CM

## 2012-06-30 MED ORDER — POLYETHYLENE GLYCOL 3350 17 GM/SCOOP PO POWD: 17.0000 g | Freq: Once | ORAL | Status: AC

## 2012-06-30 MED ORDER — BISACODYL 5 MG PO TBEC: 5.0000 mg | DELAYED_RELEASE_TABLET | ORAL | Status: DC

## 2012-06-30 MED ORDER — METOCLOPRAMIDE HCL 10 MG PO TABS: 10.0000 mg | ORAL_TABLET | ORAL | Status: DC

## 2012-06-30 MED ORDER — BISACODYL 5 MG PO TBEC: 5.0000 mg | DELAYED_RELEASE_TABLET | ORAL | Status: AC

## 2012-06-30 NOTE — Patient Instructions (Addendum)
We have sent the following medications to your pharmacy for you to pick up at your convenience: Miralax, Dulcolax, Reglan  You have been scheduled for a colonoscopy at [facility]. Please follow written instructions given to you at your visit today.  If you use inhalers (even only as needed), please bring them with you on the day of your procedure.  Thank you for choosing me and [facility].  Iva Boop, M.D., [facility]

## 2012-06-30 NOTE — Progress Notes (Signed)
Subjective:    Patient ID: Ronnie Curtis, male    DOB: 06-20-1951, 61 y.o.   MRN: [id number]  HPI The patient is a middle-aged white man with a personal history of adenomatous colon polyps. He was to have a repeat routine screening and surveillance colonoscopy last month but did not tolerate MoviPrep. He started with nausea and vomiting on the second dose and had persistent problems and was unable to switch to MiraLax prep which he had done well with in the past, most recently as of 7 2008. He is not having any gastrointestinal problems at this time, i.e. bowel movements are regular, reflux is controlled with PPI.  Allergies  Allergen Reactions  . Cialis (Tadalafil)     HAs  . Erythromycin Other (See Comments)    Sever reflux  . Maxzide (Triamterene-Hctz)     cramps  . Spironolactone     gynecomastia   Outpatient Prescriptions Prior to Visit  Medication Sig Dispense Refill  . aspirin 81 MG EC tablet Take 81 mg by mouth daily.        . Cholecalciferol 1000 UNITS tablet Take 2,000 Units by mouth daily.       . clomiPHENE (CLOMID) 50 MG tablet 1/2 tab daily  15 tablet  5  . fluticasone (FLONASE) 50 MCG/ACT nasal spray Place 2 sprays into the nose daily.      . indapamide (LOZOL) 2.5 MG tablet Take 1 tablet (2.5 mg total) by mouth every morning.  30 tablet  5  . Multiple Vitamin (MULTIVITAMIN) tablet Take 1 tablet by mouth daily.      Marland Kitchen omeprazole (PRILOSEC) 40 MG capsule Take 40 mg by mouth 2 (two) times daily. For indigestion       . oxyCODONE (ROXICODONE) 15 MG immediate release tablet Take 1 tablet (15 mg total) by mouth 4 (four) times daily. To fill on or after 07/04/2012  120 tablet  0  . valsartan (DIOVAN) 160 MG tablet Take 1 tablet (160 mg total) by mouth daily.  30 tablet  11  . Vardenafil HCl (STAXYN) 10 MG TBDP Take 10 mg by mouth daily as needed.  12 tablet  6       0                       Past Medical History  Diagnosis Date  . OSA (obstructive sleep apnea)    PSG 05/24/10 AHI 90, CPAP 13cm H2O  . Type II or unspecified type diabetes mellitus without mention of complication, not stated as uncontrolled   . GERD (gastroesophageal reflux disease)   . LBP (low back pain)   . Retinal detachment 2009    Right  . Multiple polyps of ethmoid sinus   . Obesity   . HTN (hypertension)   . Asthmatic bronchitis   . Anxiety     Panic disorder  . Depression   . Allergy     grass, mold, dust  . Arthritis     back  . Hypogonadism male   . Tubular adenomas of colon  2005 and 2008   . Hemorrhoids    Past Surgical History  Procedure Date  . Carpal tunnel release     Left  . Urethrotomy     x3  . Tonsillectomy and adenoidectomy   . Cataract extraction w/ intraocular lens  implant, bilateral 2004  . Torn retinas     left eye x 3, right eye x 2  . Colonoscopy w/  biopsies     multiple  . Nasal septum surgery 05/14/2005    bilateral polypectomy, bilateral total ethmoidectomy, left frontal sinusotomy   History   Social History  . Marital Status: Married    Spouse Name: N/A    Number of Children: 3  . Years of Education: N/A   Occupational History  . [employer] banker    Social History Main Topics  . Smoking status: Never Smoker   . Smokeless tobacco: Never Used  . Alcohol Use: 0.0 oz/week    1-2 Cans of beer per week  . Drug Use: No  . Sexually Active: Yes   Other Topics Concern  . None   Social History Narrative   Occasional THC use - deniesRegular Exercise -  NO   Family History  Problem Relation Age of Onset  . Hypertension Other   . Allergies Mother   . Lymphoma Mother   . Cancer Mother     lymphoma  . Heart disease Father     CAD, CHF  . Hypertension Brother   . Arthritis Brother     OA  . Colon cancer Neg Hx       Review of Systems This is positive for back pain, muscle cramps arthritis. He is pursuing bariatric surgery. Lap band. Currently in the diet patient phase of that.    Objective:   Physical Exam Obese, no  acute distress Abdomen is obese soft and nontender with normal bowel sounds       Assessment & Plan:   1. Special screening for malignant neoplasms, colon   2. Personal history of colonic polyps   3. Nausea with vomiting during Moviprep for colonoscopy    He has an intolerance to the MoviPrep. We'll schedule colonoscopy at the hospital given his BMI, and use MiraLax prep which has worked well in the past, to perform routine screening and surveillance colonoscopy in 5 years since his last colonoscopy in this man with a history of adenomatous colon polyps.

## 2012-07-07 ENCOUNTER — Telehealth: Payer: Self-pay | Admitting: Internal Medicine

## 2012-07-07 NOTE — Telephone Encounter (Signed)
Procedure is cancelled with Sue Lush

## 2012-07-08 ENCOUNTER — Ambulatory Visit: Payer: Managed Care, Other (non HMO) | Admitting: *Deleted

## 2012-07-09 ENCOUNTER — Ambulatory Visit (HOSPITAL_COMMUNITY)
Admission: RE | Admit: 2012-07-09 | Payer: Managed Care, Other (non HMO) | Source: Ambulatory Visit | Admitting: Internal Medicine

## 2012-07-09 SURGERY — COLONOSCOPY
Anesthesia: Moderate Sedation

## 2012-07-11 ENCOUNTER — Ambulatory Visit: Payer: Managed Care, Other (non HMO) | Admitting: *Deleted

## 2012-07-18 ENCOUNTER — Ambulatory Visit: Payer: Managed Care, Other (non HMO) | Admitting: Internal Medicine

## 2012-07-18 ENCOUNTER — Ambulatory Visit: Payer: Managed Care, Other (non HMO) | Admitting: Endocrinology

## 2012-07-28 ENCOUNTER — Telehealth: Payer: Self-pay | Admitting: Internal Medicine

## 2012-07-28 NOTE — Telephone Encounter (Signed)
OK to fill this prescription with additional refills x0 Thank you!  

## 2012-07-28 NOTE — Telephone Encounter (Signed)
Patient needs a refill on oxycodone, call patient when ready to pick up

## 2012-07-29 ENCOUNTER — Telehealth: Payer: Self-pay | Admitting: Internal Medicine

## 2012-07-29 MED ORDER — OXYCODONE HCL 15 MG PO TABS: 15.0000 mg | ORAL_TABLET | Freq: Four times a day (QID) | ORAL | Status: DC

## 2012-07-29 NOTE — Telephone Encounter (Signed)
Pt informed/ transferred to schedulers.

## 2012-07-29 NOTE — Telephone Encounter (Signed)
Rx printed. Pending MD sig,. Pt informed

## 2012-07-29 NOTE — Telephone Encounter (Signed)
Per Emergent Line: Caller: [name], Patient: Ronnie Curtis, PCP [place], [place]. [place] # [phone]. Caller reports pain in his middle back that began on Saturday 9/14. No known injury. Pain was worse when lying down. Last night, he tried lying on his stomach so back would not hurt during sleep and developed pain in his chest/the front of his body. Caller denies history of heart or lung problems. EPIC record reviewed. Per Chest Pain Protocol, Emergent Symptoms Ruled out. Per One or more occurences of unexplained pain in shoulders, neck, jaw, arms, stomach or back, lasting more than a few minutes that has not been evlauated by HCP.  See in 24 hours Disposition. No open appointments with PCP today. Caller advised a note will be sent to ask if he can be worked in today for evaluation of pain. He can be reached at above number.

## 2012-07-29 NOTE — Telephone Encounter (Signed)
Ronnie Curtis Garvin Fila MD or ER Thx

## 2012-07-30 ENCOUNTER — Ambulatory Visit (INDEPENDENT_AMBULATORY_CARE_PROVIDER_SITE_OTHER): Payer: Managed Care, Other (non HMO) | Admitting: Internal Medicine

## 2012-07-30 ENCOUNTER — Telehealth: Payer: Self-pay | Admitting: Internal Medicine

## 2012-07-30 ENCOUNTER — Ambulatory Visit (INDEPENDENT_AMBULATORY_CARE_PROVIDER_SITE_OTHER)
Admission: RE | Admit: 2012-07-30 | Discharge: 2012-07-30 | Disposition: A | Discharge status: Home / Self Care | Payer: Managed Care, Other (non HMO) | Source: Ambulatory Visit | Attending: Internal Medicine | Admitting: Internal Medicine

## 2012-07-30 ENCOUNTER — Encounter: Payer: Self-pay | Admitting: Internal Medicine

## 2012-07-30 VITALS — BP 140/100 | HR 80 | Temp 98.4°F | Resp 16 | Wt 318.5 lb

## 2012-07-30 DIAGNOSIS — M546 Pain in thoracic spine: Secondary | ICD-10-CM

## 2012-07-30 DIAGNOSIS — Z23 Encounter for immunization: Secondary | ICD-10-CM

## 2012-07-30 MED ORDER — LORCASERIN HCL 10 MG PO TABS: 1.0000 | ORAL_TABLET | Freq: Two times a day (BID) | ORAL | Status: DC

## 2012-07-30 MED ORDER — GABAPENTIN 300 MG PO CAPS: 300.0000 mg | ORAL_CAPSULE | Freq: Three times a day (TID) | ORAL | Status: DC

## 2012-07-30 MED ORDER — IBUPROFEN 600 MG PO TABS: ORAL_TABLET | ORAL | Status: DC

## 2012-07-30 NOTE — Assessment & Plan Note (Signed)
Info and Rx on Belviq as given for later

## 2012-07-30 NOTE — Telephone Encounter (Signed)
Ronnie Curtis, please, inform patient that his thor spine X ray was ok Thx!

## 2012-07-30 NOTE — Assessment & Plan Note (Signed)
9/13 acute - r/o vertebral fx Spine x ray See meds

## 2012-07-30 NOTE — Progress Notes (Signed)
Subjective:    Patient ID: Ronnie Curtis, male    DOB: [birth year], 61 y.o.   MRN: [id number]  Back Pain This is a new problem. The current episode started in the past 7 days. The problem occurs constantly. The problem is unchanged. The pain is present in the thoracic spine. Radiates to: L chest. The pain is at a severity of 10/10. The pain is severe. The pain is worse during the day. The symptoms are aggravated by lying down, twisting and coughing. Pertinent negatives include no weakness. The treatment provided mild relief.  C/o pain in his middle back that began on Saturday 9/14 when he woke up - constant. No known injury. Pain was worse when lying down.  The patient presents for a follow-up of  chronic LBP, chronic hypogonadism, HTN previously controlled with medicines. He stopped Maxzide due to cramps - resolved. He lost his job. He is preparing to get lap band done...  Wt Readings from Last 3 Encounters:  07/30/12 318 lb 8 oz (144.471 kg)  06/30/12 317 lb (143.79 kg)  06/18/12 310 lb 6 oz (140.785 kg)   BP Readings from Last 3 Encounters:  07/30/12 140/100  06/30/12 136/80  06/18/12 130/88       Review of Systems  Constitutional: Negative for appetite change, fatigue and unexpected weight change.  HENT: Negative for nosebleeds, congestion, sore throat, sneezing, trouble swallowing and neck pain.   Eyes: Negative for itching and visual disturbance.  Respiratory: Negative for cough.   Cardiovascular: Negative for leg swelling.  Gastrointestinal: Negative for diarrhea, blood in stool and abdominal distention.  Genitourinary: Negative for frequency and hematuria.  Musculoskeletal: Positive for back pain. Negative for joint swelling and gait problem.  Skin: Negative for rash.  Neurological: Negative for tremors, speech difficulty and weakness.  Psychiatric/Behavioral: Negative for disturbed wake/sleep cycle, dysphoric mood and agitation.       Objective:   Physical Exam    Constitutional: He is oriented to person, place, and time. He appears well-developed.       Very obese  HENT:  Mouth/Throat: Oropharynx is clear and moist.  Eyes: Conjunctivae normal are normal. Pupils are equal, round, and reactive to light.  Neck: Normal range of motion. No JVD present. No thyromegaly present.  Cardiovascular: Normal rate, regular rhythm, normal heart sounds and intact distal pulses.  Exam reveals no gallop and no friction rub.   No murmur heard. Pulmonary/Chest: Effort normal and breath sounds normal. No respiratory distress. He has no wheezes. He has no rales. He exhibits no tenderness.  Abdominal: Soft. Bowel sounds are normal. He exhibits no distension and no mass. There is no tenderness. There is no rebound and no guarding.  Musculoskeletal: Normal range of motion. He exhibits tenderness (LS is tender). He exhibits no edema.       R knee is not tender w/ROM  Lymphadenopathy:    He has no cervical adenopathy.  Neurological: He is alert and oriented to person, place, and time. He has normal reflexes. No cranial nerve deficit. He exhibits normal muscle tone. Coordination normal.  Skin: Skin is warm and dry. No rash noted.  Psychiatric: He has a normal mood and affect. His behavior is normal. Judgment and thought content normal.     Lab Results  Component Value Date   WBC 5.2 04/02/2012   HGB 13.2 04/02/2012   HCT 39.3 04/02/2012   PLT 223.0 04/02/2012   GLUCOSE 127* 06/18/2012   CHOL 172 04/02/2012   TRIG 129.0  04/02/2012   HDL 42.70 04/02/2012   LDLDIRECT 70.7 01/19/2009   LDLCALC 104* 04/02/2012   ALT 27 04/02/2012   AST 27 04/02/2012   NA 141 06/18/2012   K 4.4 06/18/2012   CL 102 06/18/2012   CREATININE 1.0 06/18/2012   BUN 21 06/18/2012   CO2 31 06/18/2012   TSH 2.02 06/18/2012   PSA 0.78 04/02/2012   HGBA1C 5.9 06/18/2012         Assessment & Plan:

## 2012-07-30 NOTE — Patient Instructions (Addendum)
call if rash

## 2012-07-31 DIAGNOSIS — Z23 Encounter for immunization: Secondary | ICD-10-CM | POA: Insufficient documentation

## 2012-07-31 NOTE — Telephone Encounter (Signed)
Pt informed

## 2012-08-18 ENCOUNTER — Telehealth: Payer: Self-pay | Admitting: Internal Medicine

## 2012-08-18 NOTE — Telephone Encounter (Signed)
Forward 4 pages from [facility] to Dr. Macarthur Critchley Plotnikov for review on 08-18-12 ym

## 2012-08-21 ENCOUNTER — Telehealth: Payer: Self-pay | Admitting: Internal Medicine

## 2012-08-21 NOTE — Telephone Encounter (Signed)
Caller: Ronnie Curtis/Patient; Phone: [phone]; Reason for Call: Patient calling to get a refill on Omeprazole and to see if he can get samples of the Prilosec.  Please call him back.  Thanks

## 2012-08-29 ENCOUNTER — Encounter: Payer: Self-pay | Admitting: Internal Medicine

## 2012-08-29 ENCOUNTER — Ambulatory Visit (INDEPENDENT_AMBULATORY_CARE_PROVIDER_SITE_OTHER): Payer: Managed Care, Other (non HMO) | Admitting: Internal Medicine

## 2012-08-29 ENCOUNTER — Ambulatory Visit (INDEPENDENT_AMBULATORY_CARE_PROVIDER_SITE_OTHER): Payer: Managed Care, Other (non HMO) | Admitting: Endocrinology

## 2012-08-29 VITALS — BP 116/80 | HR 71 | Temp 98.1°F | Wt 324.0 lb

## 2012-08-29 VITALS — BP 118/86 | HR 80 | Temp 98.1°F | Resp 16 | Wt 323.0 lb

## 2012-08-29 DIAGNOSIS — M545 Low back pain: Secondary | ICD-10-CM

## 2012-08-29 DIAGNOSIS — J069 Acute upper respiratory infection, unspecified: Secondary | ICD-10-CM

## 2012-08-29 DIAGNOSIS — I10 Essential (primary) hypertension: Secondary | ICD-10-CM

## 2012-08-29 DIAGNOSIS — E291 Testicular hypofunction: Secondary | ICD-10-CM

## 2012-08-29 MED ORDER — OXYCODONE HCL 15 MG PO TABS: 15.0000 mg | ORAL_TABLET | Freq: Four times a day (QID) | ORAL | Status: DC

## 2012-08-29 NOTE — Patient Instructions (Signed)
blood tests are being requested for you today.  You will be contacted with results. Please return in 1 year. 

## 2012-08-29 NOTE — Assessment & Plan Note (Signed)
OTC cold meds

## 2012-08-29 NOTE — Assessment & Plan Note (Signed)
Continue with current prescription therapy as reflected on the Med list.  

## 2012-08-29 NOTE — Progress Notes (Signed)
Subjective:    Patient ID: Ronnie Curtis, male    DOB: 12-Nov-1951, 61 y.o.   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  He stopped clomid 6 weeks ago.  He resumed 10 days ago.   Gynecomastia persists. Past Medical History  Diagnosis Date  . OSA (obstructive sleep apnea)     PSG 05/24/10 AHI 90, CPAP 13cm H2O  . Type II or unspecified type diabetes mellitus without mention of complication, not stated as uncontrolled   . GERD (gastroesophageal reflux disease)   . LBP (low back pain)   . Retinal detachment 2009    Right  . Multiple polyps of ethmoid sinus   . Obesity   . HTN (hypertension)   . Asthmatic bronchitis   . Anxiety     Panic disorder  . Depression   . Allergy     grass, mold, dust  . Arthritis     back  . Hypogonadism male   . Tubular adenoma of colon     2 in 2005 and 3 in 2008  . Hemorrhoids     Past Surgical History  Procedure Date  . Carpal tunnel release     Left  . Urethrotomy     x3  . Tonsillectomy and adenoidectomy   . Cataract extraction w/ intraocular lens  implant, bilateral 2004  . Torn retinas     left eye x 3, right eye x 2  . Colonoscopy w/ biopsies     multiple  . Nasal septum surgery 05/14/2005    bilateral polypectomy, bilateral total ethmoidectomy, left frontal sinusotomy    History   Social History  . Marital Status: Married    Spouse Name: N/A    Number of Children: 3  . Years of Education: N/A   Occupational History  . [employer] banker    Social History Main Topics  . Smoking status: Never Smoker   . Smokeless tobacco: Never Used  . Alcohol Use: 0.0 oz/week    1-2 Cans of beer per week  . Drug Use: No  . Sexually Active: Yes   Other Topics Concern  . Not on file   Social History Narrative   Occasional THC use - deniesRegular Exercise -  NO    Current Outpatient Prescriptions on File Prior to Visit  Medication Sig Dispense Refill  .  aspirin 81 MG EC tablet Take 81 mg by mouth daily.        . Cholecalciferol 1000 UNITS tablet Take 2,000 Units by mouth daily.       . clomiPHENE (CLOMID) 50 MG tablet 1/2 tab daily  15 tablet  5  . fluticasone (FLONASE) 50 MCG/ACT nasal spray Place 2 sprays into the nose daily.      Marland Kitchen gabapentin (NEURONTIN) 300 MG capsule Take 1 capsule (300 mg total) by mouth 3 (three) times daily.  90 capsule  1  . ibuprofen (ADVIL,MOTRIN) 600 MG tablet Take twice a day x 1-2 weeks, then prn pain  60 tablet  1  . indapamide (LOZOL) 2.5 MG tablet Take 1 tablet (2.5 mg total) by mouth every morning.  30 tablet  5  . Lorcaserin HCl (BELVIQ) 10 MG TABS Take 1 tablet by mouth 2 (two) times daily.  60 tablet  1  . metoCLOPramide (REGLAN) 10 MG tablet Take 10 mg by mouth as directed. Take as directed.      . Multiple Vitamin (  MULTIVITAMIN) tablet Take 1 tablet by mouth daily.      Marland Kitchen omeprazole (PRILOSEC) 40 MG capsule Take 40 mg by mouth 2 (two) times daily. For indigestion       . oxyCODONE (ROXICODONE) 15 MG immediate release tablet Take 1 tablet (15 mg total) by mouth 4 (four) times daily. To fill on or after 09/29/2012  120 tablet  0  . valsartan (DIOVAN) 160 MG tablet Take 1 tablet (160 mg total) by mouth daily.  30 tablet  11  . Vardenafil HCl (STAXYN) 10 MG TBDP Take 10 mg by mouth daily as needed.  12 tablet  6    Allergies  Allergen Reactions  . Moviprep (Peg-Kcl-Nacl-Nasulf-Na Asc-C) Nausea And Vomiting  . Cialis (Tadalafil)     HAs  . Erythromycin Other (See Comments)    Sever reflux  . Maxzide (Triamterene-Hctz)     cramps  . Spironolactone     gynecomastia    Family History  Problem Relation Age of Onset  . Hypertension Other   . Allergies Mother   . Lymphoma Mother   . Cancer Mother     lymphoma  . Heart disease Father     CAD, CHF  . Hypertension Brother   . Arthritis Brother     OA  . Colon cancer Neg Hx    BP 116/80  Pulse 71  Temp 98.1 F (36.7 C) (Oral)  Wt 324 lb  (146.965 kg)  SpO2 96%  Review of Systems Denies decreased urinary stream.      Objective:   Physical Exam VITAL SIGNS:  See vs page GENERAL: no distress Breasts: bilat pseudogynecomastia GENITALIA: Normal male testicles, scrotum, and penis   Lab Results  Component Value Date   TESTOSTERONE 298.42* 08/29/2012      Assessment & Plan:  Hypogonadism, therapy limited by noncompliance.  i'll do the best i can.

## 2012-08-29 NOTE — Progress Notes (Signed)
Patient ID: Ronnie Curtis, male   DOB: 07-20-1951, 61 y.o.   MRN: [id number]   Subjective:    Patient ID: Ronnie Curtis, male    DOB: July 16, 1951, 61 y.o.   MRN: [id number]  Back Pain This is a new problem. The current episode started 1 to 4 weeks ago. The problem occurs constantly. The problem has been gradually improving since onset. The pain is present in the thoracic spine. Radiates to: L chest. The pain is at a severity of 2/10. The pain is mild. The pain is the same all the time. The symptoms are aggravated by lying down, twisting and coughing. Pertinent negatives include no weakness. The treatment provided significant relief.  C/o pain in his middle back that began on Saturday 9/14 - much better  C/o URI x 10 d - [facility]  The patient presents for a follow-up of  chronic LBP, chronic hypogonadism, HTN previously controlled with medicines. He stopped Maxzide due to cramps - resolved. He lost his job. He is preparing to get lap band done...  Wt Readings from Last 3 Encounters:  08/29/12 323 lb (146.512 kg)  07/30/12 318 lb 8 oz (144.471 kg)  06/30/12 317 lb (143.79 kg)   BP Readings from Last 3 Encounters:  08/29/12 118/86  07/30/12 140/100  06/30/12 136/80       Review of Systems  Constitutional: Negative for appetite change, fatigue and unexpected weight change.  HENT: Negative for nosebleeds, congestion, sore throat, sneezing, trouble swallowing and neck pain.   Eyes: Negative for itching and visual disturbance.  Respiratory: Negative for cough.   Cardiovascular: Negative for leg swelling.  Gastrointestinal: Negative for diarrhea, blood in stool and abdominal distention.  Genitourinary: Negative for frequency and hematuria.  Musculoskeletal: Positive for back pain. Negative for joint swelling and gait problem.  Skin: Negative for rash.  Neurological: Negative for tremors, speech difficulty and weakness.  Psychiatric/Behavioral: Negative for  disturbed wake/sleep cycle, dysphoric mood and agitation.       Objective:   Physical Exam  Constitutional: He is oriented to person, place, and time. He appears well-developed.       Very obese  HENT:  Mouth/Throat: Oropharynx is clear and moist.  Eyes: Conjunctivae normal are normal. Pupils are equal, round, and reactive to light.  Neck: Normal range of motion. No JVD present. No thyromegaly present.  Cardiovascular: Normal rate, regular rhythm, normal heart sounds and intact distal pulses.  Exam reveals no gallop and no friction rub.   No murmur heard. Pulmonary/Chest: Effort normal and breath sounds normal. No respiratory distress. He has no wheezes. He has no rales. He exhibits no tenderness.  Abdominal: Soft. Bowel sounds are normal. He exhibits no distension and no mass. There is no tenderness. There is no rebound and no guarding.  Musculoskeletal: Normal range of motion. He exhibits tenderness (LS is tender). He exhibits no edema.       R knee is not tender w/ROM  Lymphadenopathy:    He has no cervical adenopathy.  Neurological: He is alert and oriented to person, place, and time. He has normal reflexes. No cranial nerve deficit. He exhibits normal muscle tone. Coordination normal.  Skin: Skin is warm and dry. No rash noted.  Psychiatric: He has a normal mood and affect. His behavior is normal. Judgment and thought content normal.     Lab Results  Component Value Date   WBC 5.2 04/02/2012   HGB 13.2 04/02/2012   HCT 39.3  04/02/2012   PLT 223.0 04/02/2012   GLUCOSE 127* 06/18/2012   CHOL 172 04/02/2012   TRIG 129.0 04/02/2012   HDL 42.70 04/02/2012   LDLDIRECT 70.7 01/19/2009   LDLCALC 104* 04/02/2012   ALT 27 04/02/2012   AST 27 04/02/2012   NA 141 06/18/2012   K 4.4 06/18/2012   CL 102 06/18/2012   CREATININE 1.0 06/18/2012   BUN 21 06/18/2012   CO2 31 06/18/2012   TSH 2.02 06/18/2012   PSA 0.78 04/02/2012   HGBA1C 5.9 06/18/2012         Assessment & Plan:

## 2012-09-08 ENCOUNTER — Telehealth: Payer: Self-pay | Admitting: Internal Medicine

## 2012-09-08 NOTE — Telephone Encounter (Signed)
He states that [employer] is needing a letter of medical necessity for his Prilosec.  He was in about 1 1/2 wks ago and saw doctor and he said mentioned it to him.  He said today he checked with [employer] and they have not received anything yet.  They gave him and a number and said that you could just call and get if approved.  [phone]

## 2012-09-10 NOTE — Telephone Encounter (Signed)
Called number below and PA is approved for 36 months. Pt informed

## 2012-09-22 ENCOUNTER — Other Ambulatory Visit: Payer: Self-pay | Admitting: Internal Medicine

## 2012-10-11 ENCOUNTER — Other Ambulatory Visit: Payer: Self-pay | Admitting: Internal Medicine

## 2012-10-13 MED ORDER — VALSARTAN 160 MG PO TABS: 160.0000 mg | ORAL_TABLET | Freq: Every day | ORAL | Status: DC

## 2012-10-13 NOTE — Addendum Note (Signed)
Addended by: Merrilyn Puma on: 10/13/2012 03:12 PM   Modules accepted: Orders

## 2012-10-17 ENCOUNTER — Telehealth: Payer: Self-pay

## 2012-10-17 MED ORDER — OXYCODONE HCL 15 MG PO TABS: 15.0000 mg | ORAL_TABLET | Freq: Four times a day (QID) | ORAL | Status: DC

## 2012-10-17 NOTE — Telephone Encounter (Signed)
Ok to fill early Thx 

## 2012-10-17 NOTE — Telephone Encounter (Signed)
Pt called stating [place] is going to be out of the [place] when his pain medication is due. Pt is requesting to pick up refill today so he can get it filled as soon as he returns, please advise.

## 2012-10-17 NOTE — Telephone Encounter (Signed)
Rx printed/ready for pickup. Pt informed

## 2012-10-20 ENCOUNTER — Other Ambulatory Visit: Payer: Self-pay | Admitting: *Deleted

## 2012-10-20 MED ORDER — VALSARTAN 160 MG PO TABS: 160.0000 mg | ORAL_TABLET | Freq: Every day | ORAL | Status: DC

## 2012-11-04 ENCOUNTER — Encounter: Payer: Self-pay | Admitting: Internal Medicine

## 2012-11-04 ENCOUNTER — Ambulatory Visit (INDEPENDENT_AMBULATORY_CARE_PROVIDER_SITE_OTHER): Payer: Managed Care, Other (non HMO) | Admitting: Internal Medicine

## 2012-11-04 DIAGNOSIS — F329 Major depressive disorder, single episode, unspecified: Secondary | ICD-10-CM

## 2012-11-04 DIAGNOSIS — E119 Type 2 diabetes mellitus without complications: Secondary | ICD-10-CM

## 2012-11-04 DIAGNOSIS — F411 Generalized anxiety disorder: Secondary | ICD-10-CM

## 2012-11-04 DIAGNOSIS — G4733 Obstructive sleep apnea (adult) (pediatric): Secondary | ICD-10-CM

## 2012-11-04 DIAGNOSIS — E291 Testicular hypofunction: Secondary | ICD-10-CM

## 2012-11-04 DIAGNOSIS — I10 Essential (primary) hypertension: Secondary | ICD-10-CM

## 2012-11-04 MED ORDER — ESCITALOPRAM OXALATE 10 MG PO TABS: 10.0000 mg | ORAL_TABLET | Freq: Every day | ORAL | Status: DC

## 2012-11-04 MED ORDER — OXYCODONE HCL 15 MG PO TABS: 15.0000 mg | ORAL_TABLET | Freq: Four times a day (QID) | ORAL | Status: DC

## 2012-11-04 MED ORDER — ALPRAZOLAM 0.25 MG PO TABS
0.2500 mg | ORAL_TABLET | Freq: Two times a day (BID) | ORAL | Status: DC | PRN
Start: 2012-11-04 — End: 2012-12-28

## 2012-11-04 NOTE — Assessment & Plan Note (Signed)
discussed

## 2012-11-04 NOTE — Assessment & Plan Note (Signed)
Lexapro 10 mg/d to start Psychol ref was offered

## 2012-11-04 NOTE — Assessment & Plan Note (Signed)
Continue with current prescription therapy as reflected on the Med list.  

## 2012-11-04 NOTE — Assessment & Plan Note (Signed)
Monitoring

## 2012-11-04 NOTE — Progress Notes (Signed)
   Subjective:    Patient ID: Ronnie Curtis, male    DOB: [birth year], 61 y.o.   MRN: [id number]  HPI C/o claustrophobic anxiety on a plane, bus - several times lately...  The patient presents for a follow-up of  chronic LBP, chronic hypogonadism, HTN previously controlled with medicines. He stopped Maxzide due to cramps - resolved. Belviq did not help...   Wt Readings from Last 3 Encounters:  11/04/12 337 lb (152.862 kg)  08/29/12 324 lb (146.965 kg)  08/29/12 323 lb (146.512 kg)   BP Readings from Last 3 Encounters:  11/04/12 128/60  08/29/12 116/80  08/29/12 118/86       Review of Systems  Constitutional: Negative for appetite change, fatigue and unexpected weight change.  HENT: Negative for nosebleeds, congestion, sore throat, sneezing, trouble swallowing and neck pain.   Eyes: Negative for itching and visual disturbance.  Respiratory: Negative for cough.   Cardiovascular: Negative for leg swelling.  Gastrointestinal: Negative for diarrhea, blood in stool and abdominal distention.  Genitourinary: Negative for frequency and hematuria.  Musculoskeletal: Negative for joint swelling and gait problem.  Skin: Negative for rash.  Neurological: Negative for tremors and speech difficulty.  Psychiatric/Behavioral: Positive for decreased concentration. Negative for suicidal ideas, behavioral problems, confusion, sleep disturbance, self-injury, dysphoric mood and agitation. The patient is nervous/anxious.        Objective:   Physical Exam  Constitutional: He is oriented to person, place, and time. He appears well-developed.       Very obese  HENT:  Mouth/Throat: Oropharynx is clear and moist.  Eyes: Conjunctivae normal are normal. Pupils are equal, round, and reactive to light.  Neck: Normal range of motion. No JVD present. No thyromegaly present.  Cardiovascular: Normal rate, regular rhythm, normal heart sounds and intact distal pulses.  Exam reveals no gallop and no  friction rub.   No murmur heard. Pulmonary/Chest: Effort normal and breath sounds normal. No respiratory distress. He has no wheezes. He has no rales. He exhibits no tenderness.  Abdominal: Soft. Bowel sounds are normal. He exhibits no distension and no mass. There is no tenderness. There is no rebound and no guarding.  Musculoskeletal: Normal range of motion. He exhibits tenderness (LS is tender). He exhibits no edema.       R knee is not tender w/ROM  Lymphadenopathy:    He has no cervical adenopathy.  Neurological: He is alert and oriented to person, place, and time. He has normal reflexes. No cranial nerve deficit. He exhibits normal muscle tone. Coordination normal.  Skin: Skin is warm and dry. No rash noted. No erythema.  Psychiatric: His behavior is normal. Judgment and thought content normal.     Lab Results  Component Value Date   WBC 5.2 04/02/2012   HGB 13.2 04/02/2012   HCT 39.3 04/02/2012   PLT 223.0 04/02/2012   GLUCOSE 127* 06/18/2012   CHOL 172 04/02/2012   TRIG 129.0 04/02/2012   HDL 42.70 04/02/2012   LDLDIRECT 70.7 01/19/2009   LDLCALC 104* 04/02/2012   ALT 27 04/02/2012   AST 27 04/02/2012   NA 141 06/18/2012   K 4.4 06/18/2012   CL 102 06/18/2012   CREATININE 1.0 06/18/2012   BUN 21 06/18/2012   CO2 31 06/18/2012   TSH 2.02 06/18/2012   PSA 0.78 04/02/2012   HGBA1C 5.9 06/18/2012         Assessment & Plan:

## 2012-11-10 ENCOUNTER — Other Ambulatory Visit: Payer: Self-pay | Admitting: Internal Medicine

## 2012-11-17 ENCOUNTER — Other Ambulatory Visit: Payer: Self-pay | Admitting: Internal Medicine

## 2012-11-17 ENCOUNTER — Telehealth: Payer: Self-pay | Admitting: Internal Medicine

## 2012-11-17 NOTE — Telephone Encounter (Signed)
Patient is calling stating that his insurance company is denying his indapamide at his local pharmacy and they want it to be called in to [facility]

## 2012-11-18 MED ORDER — INDAPAMIDE 2.5 MG PO TABS: 2.5000 mg | ORAL_TABLET | Freq: Every day | ORAL | Status: DC

## 2012-11-18 NOTE — Telephone Encounter (Signed)
Done. Left detailed mess informing pt of below.   

## 2012-11-21 ENCOUNTER — Ambulatory Visit (INDEPENDENT_AMBULATORY_CARE_PROVIDER_SITE_OTHER): Payer: Managed Care, Other (non HMO) | Admitting: Internal Medicine

## 2012-11-21 ENCOUNTER — Encounter: Payer: Self-pay | Admitting: Internal Medicine

## 2012-11-21 VITALS — BP 120/84 | HR 84 | Temp 98.3°F | Resp 16 | Wt 314.0 lb

## 2012-11-21 DIAGNOSIS — J449 Chronic obstructive pulmonary disease, unspecified: Secondary | ICD-10-CM

## 2012-11-21 DIAGNOSIS — R062 Wheezing: Secondary | ICD-10-CM

## 2012-11-21 DIAGNOSIS — M545 Low back pain: Secondary | ICD-10-CM

## 2012-11-21 DIAGNOSIS — J069 Acute upper respiratory infection, unspecified: Secondary | ICD-10-CM

## 2012-11-21 MED ORDER — METHYLPREDNISOLONE ACETATE 80 MG/ML IJ SUSP
120.0000 mg | Freq: Once | INTRAMUSCULAR | Status: AC
  Administered 2012-11-21: 120 mg via INTRAMUSCULAR

## 2012-11-21 MED ORDER — OXYCODONE HCL 15 MG PO TABS: 15.0000 mg | ORAL_TABLET | Freq: Four times a day (QID) | ORAL | Status: DC

## 2012-11-21 MED ORDER — FLUTICASONE FUROATE-VILANTEROL 100-25 MCG/INH IN AEPB: 1.0000 | INHALATION_SPRAY | Freq: Every day | RESPIRATORY_TRACT | Status: DC

## 2012-11-21 MED ORDER — DOXYCYCLINE HYCLATE 100 MG PO TABS: 100.0000 mg | ORAL_TABLET | Freq: Two times a day (BID) | ORAL | Status: DC

## 2012-11-21 NOTE — Progress Notes (Signed)
Subjective:    Cough This is a new problem. The current episode started 1 to 4 weeks ago. The problem has been gradually improving. The cough is productive of sputum. Associated symptoms include chest pain and wheezing. Pertinent negatives include no rash or sore throat. His past medical history is significant for COPD.  Wheezing  This is a recurrent problem. The current episode started 1 to 4 weeks ago. Associated symptoms include chest pain and coughing. Pertinent negatives include no diarrhea, neck pain, rash or sore throat. His past medical history is significant for COPD.   C/o claustrophobic anxiety on a plane, bus - several times lately...  The patient presents for a follow-up of  chronic LBP, chronic hypogonadism, HTN previously controlled with medicines. He stopped Maxzide due to cramps - resolved. Belviq did not help...   Wt Readings from Last 3 Encounters:  11/21/12 314 lb (142.429 kg)  11/04/12 337 lb (152.862 kg)  08/29/12 324 lb (146.965 kg)   BP Readings from Last 3 Encounters:  11/21/12 120/84  11/04/12 128/60  08/29/12 116/80       Review of Systems  Constitutional: Negative for appetite change, fatigue and unexpected weight change.  HENT: Negative for nosebleeds, congestion, sore throat, sneezing, trouble swallowing and neck pain.   Eyes: Negative for itching and visual disturbance.  Respiratory: Positive for cough and wheezing.   Cardiovascular: Positive for chest pain. Negative for leg swelling.  Gastrointestinal: Negative for diarrhea, blood in stool and abdominal distention.  Genitourinary: Negative for frequency and hematuria.  Musculoskeletal: Negative for joint swelling and gait problem.  Skin: Negative for rash.  Neurological: Negative for tremors and speech difficulty.  Psychiatric/Behavioral: Positive for decreased concentration. Negative for suicidal ideas, behavioral problems, confusion, sleep disturbance, self-injury, dysphoric mood and  agitation. The patient is nervous/anxious.        Objective:   Physical Exam  Constitutional: He is oriented to person, place, and time. He appears well-developed.       Very obese  HENT:  Mouth/Throat: Oropharynx is clear and moist.  Eyes: Conjunctivae normal are normal. Pupils are equal, round, and reactive to light.  Neck: Normal range of motion. No JVD present. No thyromegaly present.  Cardiovascular: Normal rate, regular rhythm, normal heart sounds and intact distal pulses.  Exam reveals no gallop and no friction rub.   No murmur heard. Pulmonary/Chest: Effort normal and breath sounds normal. No respiratory distress. He has no wheezes. He has no rales. He exhibits no tenderness.  Abdominal: Soft. Bowel sounds are normal. He exhibits no distension and no mass. There is no tenderness. There is no rebound and no guarding.  Musculoskeletal: Normal range of motion. He exhibits tenderness (LS is tender). He exhibits no edema.       R knee is not tender w/ROM  Lymphadenopathy:    He has no cervical adenopathy.  Neurological: He is alert and oriented to person, place, and time. He has normal reflexes. No cranial nerve deficit. He exhibits normal muscle tone. Coordination normal.  Skin: Skin is warm and dry. No rash noted. No erythema.  Psychiatric: His behavior is normal. Judgment and thought content normal.     Lab Results  Component Value Date   WBC 5.2 04/02/2012   HGB 13.2 04/02/2012   HCT 39.3 04/02/2012   PLT 223.0 04/02/2012   GLUCOSE 127* 06/18/2012   CHOL 172 04/02/2012   TRIG 129.0 04/02/2012   HDL 42.70 04/02/2012   LDLDIRECT 70.7 01/19/2009   LDLCALC 104* 04/02/2012  ALT 27 04/02/2012   AST 27 04/02/2012   NA 141 06/18/2012   K 4.4 06/18/2012   CL 102 06/18/2012   CREATININE 1.0 06/18/2012   BUN 21 06/18/2012   CO2 31 06/18/2012   TSH 2.02 06/18/2012   PSA 0.78 04/02/2012   HGBA1C 5.9 06/18/2012     I personally provided the [facility] inhaler use teaching. After the teaching patient was  able to demonstrate it's use effectively. All questions were answered     Assessment & Plan:

## 2012-11-23 NOTE — Assessment & Plan Note (Signed)
Doxy x 10 d 

## 2012-11-23 NOTE — Assessment & Plan Note (Addendum)
Breo Depomedrol 120 mg

## 2012-11-23 NOTE — Assessment & Plan Note (Signed)
Oxy refilled

## 2012-12-04 ENCOUNTER — Telehealth: Payer: Self-pay | Admitting: *Deleted

## 2012-12-04 NOTE — Telephone Encounter (Signed)
Breo Ellipta Inhaler is not covered by insurance. Pt must use Advair, Dulera or Symbicort. I called pt and informed him of this. He states he is better now and does not need any further meds at this time. Will disregard fax from pharmacy and advised pt to let us know if he needs a Rx for any of the above covered meds. He understands and agrees.

## 2012-12-23 ENCOUNTER — Telehealth: Payer: Self-pay | Admitting: *Deleted

## 2012-12-23 MED ORDER — TRIAMTERENE 50 MG PO CAPS: 50.0000 mg | ORAL_CAPSULE | Freq: Two times a day (BID) | ORAL | Status: DC

## 2012-12-23 NOTE — Telephone Encounter (Signed)
try Triamterene instead. If cramps - top Triamterene Thx

## 2012-12-23 NOTE — Telephone Encounter (Signed)
Rec fax from pharmacy requesting to change Indapamide 2.5 mg 1 qam as its not covered. Please advise

## 2012-12-24 NOTE — Telephone Encounter (Signed)
Pt informed- he states he has no insurance. So he paid cash for the Indapamide.

## 2012-12-28 ENCOUNTER — Other Ambulatory Visit: Payer: Self-pay | Admitting: Internal Medicine

## 2013-01-19 ENCOUNTER — Other Ambulatory Visit: Payer: Self-pay

## 2013-01-19 NOTE — Telephone Encounter (Signed)
Pt no longer has insurance. Diovan is too expensive. Requesting cheaper alternative. Please advise.

## 2013-01-21 ENCOUNTER — Telehealth: Payer: Self-pay | Admitting: Internal Medicine

## 2013-01-21 MED ORDER — OXYCODONE HCL 15 MG PO TABS: 15.0000 mg | ORAL_TABLET | Freq: Four times a day (QID) | ORAL | Status: DC

## 2013-01-21 NOTE — Telephone Encounter (Signed)
Pt states he has called 2 days in a row to get his med, oxyCODONE (ROXICODONE) 15 MG immediate release tablet. Pt is out of meds, in lobby waiting

## 2013-01-21 NOTE — Telephone Encounter (Signed)
done

## 2013-01-21 NOTE — Telephone Encounter (Signed)
Ok to fill if time Thx

## 2013-01-27 ENCOUNTER — Other Ambulatory Visit: Payer: Self-pay | Admitting: *Deleted

## 2013-01-27 NOTE — Telephone Encounter (Signed)
Losartan Thx

## 2013-01-27 NOTE — Telephone Encounter (Signed)
Pt no longer has insurance. Diovan is too expensive. Requesting cheaper alternative. Please advise.

## 2013-01-28 MED ORDER — LOSARTAN POTASSIUM 100 MG PO TABS: 100.0000 mg | ORAL_TABLET | Freq: Every day | ORAL | Status: DC

## 2013-01-28 NOTE — Telephone Encounter (Signed)
Done. Pt informed.

## 2013-02-12 ENCOUNTER — Telehealth: Payer: Self-pay | Admitting: *Deleted

## 2013-02-12 NOTE — Telephone Encounter (Signed)
OK to fill this prescription with additional refills x0 OV Thank you!

## 2013-02-12 NOTE — Telephone Encounter (Signed)
Left msg on vm requesting refill on his percocet...Raechel Chute

## 2013-02-13 MED ORDER — OXYCODONE HCL 15 MG PO TABS: 15.0000 mg | ORAL_TABLET | Freq: Four times a day (QID) | ORAL | Status: DC

## 2013-02-13 NOTE — Telephone Encounter (Signed)
Notified pt rx ready for pick-up.../lmb 

## 2013-02-14 ENCOUNTER — Other Ambulatory Visit: Payer: Self-pay | Admitting: Internal Medicine

## 2013-03-12 ENCOUNTER — Ambulatory Visit: Payer: Managed Care, Other (non HMO) | Admitting: Internal Medicine

## 2013-03-13 ENCOUNTER — Ambulatory Visit: Payer: Managed Care, Other (non HMO) | Admitting: Internal Medicine

## 2013-03-13 ENCOUNTER — Telehealth: Payer: Self-pay | Admitting: Internal Medicine

## 2013-03-13 MED ORDER — OXYCODONE HCL 15 MG PO TABS: 15.0000 mg | ORAL_TABLET | Freq: Four times a day (QID) | ORAL | Status: DC

## 2013-03-13 NOTE — Telephone Encounter (Signed)
Pt informed, Rx in cabinet for pt pick up  

## 2013-03-13 NOTE — Telephone Encounter (Signed)
The patient was scheduled to see Rene Kocher at 1pm today.  He has been re-scheduled to see his PCP, Dr.Plotnikov on Wednesday, May 7th.  He is in need of a refill of his oxycodone medicine to last until he sees Dr.Plotnikov.  This was his original reason for coming in to see [facility].  His pharmacy is the [facility] on [address].

## 2013-03-13 NOTE — Telephone Encounter (Signed)
Done - Ok to refill 30d, no refill (per protocol covering for absent PCP) - prescription printed and signed -

## 2013-03-18 ENCOUNTER — Ambulatory Visit (INDEPENDENT_AMBULATORY_CARE_PROVIDER_SITE_OTHER): Payer: BC Managed Care – PPO | Admitting: Internal Medicine

## 2013-03-18 ENCOUNTER — Encounter: Payer: Self-pay | Admitting: Internal Medicine

## 2013-03-18 VITALS — BP 130/78 | HR 84 | Temp 98.0°F | Resp 16 | Wt 318.0 lb

## 2013-03-18 DIAGNOSIS — E119 Type 2 diabetes mellitus without complications: Secondary | ICD-10-CM

## 2013-03-18 DIAGNOSIS — I10 Essential (primary) hypertension: Secondary | ICD-10-CM

## 2013-03-18 DIAGNOSIS — M545 Low back pain: Secondary | ICD-10-CM

## 2013-03-18 DIAGNOSIS — F411 Generalized anxiety disorder: Secondary | ICD-10-CM

## 2013-03-18 MED ORDER — OXYCODONE HCL 15 MG PO TABS: 15.0000 mg | ORAL_TABLET | Freq: Four times a day (QID) | ORAL | Status: DC

## 2013-03-18 NOTE — Assessment & Plan Note (Signed)
Continue with current prescription therapy as reflected on the Med list.  

## 2013-03-18 NOTE — Progress Notes (Signed)
   Subjective:    HPI   The patient presents for a follow-up of  chronic LBP, chronic hypogonadism, HTN previously controlled with medicines. He stopped Maxzide due to cramps - resolved. He is not working now - on [employer] from Last 3 Encounters:  03/18/13 318 lb (144.244 kg)  11/21/12 314 lb (142.429 kg)  11/04/12 337 lb (152.862 kg)   BP Readings from Last 3 Encounters:  03/18/13 130/78  11/21/12 120/84  11/04/12 128/60       Review of Systems  Constitutional: Negative for appetite change, fatigue and unexpected weight change.  HENT: Negative for nosebleeds, congestion, sore throat, sneezing, trouble swallowing and neck pain.   Eyes: Negative for itching and visual disturbance.  Respiratory: Negative for cough.   Cardiovascular: Negative for leg swelling.  Gastrointestinal: Negative for diarrhea, blood in stool and abdominal distention.  Genitourinary: Negative for frequency and hematuria.  Musculoskeletal: Negative for joint swelling and gait problem.  Skin: Negative for rash.  Neurological: Negative for tremors and speech difficulty.  Psychiatric/Behavioral: Positive for decreased concentration. Negative for suicidal ideas, behavioral problems, confusion, sleep disturbance, self-injury, dysphoric mood and agitation. The patient is nervous/anxious.        Objective:   Physical Exam  Constitutional: He is oriented to person, place, and time. He appears well-developed.  Very obese  HENT:  Mouth/Throat: Oropharynx is clear and moist.  Eyes: Conjunctivae are normal. Pupils are equal, round, and reactive to light.  Neck: Normal range of motion. No JVD present. No thyromegaly present.  Cardiovascular: Normal rate, regular rhythm, normal heart sounds and intact distal pulses.  Exam reveals no gallop and no friction rub.   No murmur heard. Pulmonary/Chest: Effort normal and breath sounds normal. No respiratory distress. He has no wheezes. He has no rales. He  exhibits no tenderness.  Abdominal: Soft. Bowel sounds are normal. He exhibits no distension and no mass. There is no tenderness. There is no rebound and no guarding.  Musculoskeletal: Normal range of motion. He exhibits tenderness (LS is tender). He exhibits no edema.  R knee is not tender w/ROM  Lymphadenopathy:    He has no cervical adenopathy.  Neurological: He is alert and oriented to person, place, and time. He has normal reflexes. No cranial nerve deficit. He exhibits normal muscle tone. Coordination normal.  Skin: Skin is warm and dry. No rash noted. No erythema.  Psychiatric: His behavior is normal. Judgment and thought content normal.     Lab Results  Component Value Date   WBC 5.2 04/02/2012   HGB 13.2 04/02/2012   HCT 39.3 04/02/2012   PLT 223.0 04/02/2012   GLUCOSE 127* 06/18/2012   CHOL 172 04/02/2012   TRIG 129.0 04/02/2012   HDL 42.70 04/02/2012   LDLDIRECT 70.7 01/19/2009   LDLCALC 104* 04/02/2012   ALT 27 04/02/2012   AST 27 04/02/2012   NA 141 06/18/2012   K 4.4 06/18/2012   CL 102 06/18/2012   CREATININE 1.0 06/18/2012   BUN 21 06/18/2012   CO2 31 06/18/2012   TSH 2.02 06/18/2012   PSA 0.78 04/02/2012   HGBA1C 5.9 06/18/2012         Assessment & Plan:

## 2013-03-18 NOTE — Assessment & Plan Note (Signed)
Labs Loose wt 

## 2013-03-18 NOTE — Assessment & Plan Note (Signed)
Continue with current prn prescription therapy as reflected on the Med list.  

## 2013-04-13 ENCOUNTER — Other Ambulatory Visit: Payer: Self-pay | Admitting: Internal Medicine

## 2013-04-13 ENCOUNTER — Telehealth: Payer: Self-pay | Admitting: *Deleted

## 2013-04-13 ENCOUNTER — Ambulatory Visit: Payer: 59

## 2013-04-13 MED ORDER — OXYCODONE HCL 15 MG PO TABS: 15.0000 mg | ORAL_TABLET | Freq: Four times a day (QID) | ORAL | Status: DC

## 2013-04-13 NOTE — Telephone Encounter (Signed)
Pt was seen at 03/18/2013 OV and was given Oxycodone rx's for June, July and August. Pt states that he has thrown away rx's for Oxycodone and kept [facility] by mistake. Pt is asking for new rx's for Oxycodone.

## 2013-04-14 NOTE — Telephone Encounter (Signed)
I checked [place] CSR - ok Given new Rx AP

## 2013-06-02 ENCOUNTER — Other Ambulatory Visit: Payer: Self-pay | Admitting: *Deleted

## 2013-06-02 MED ORDER — LOSARTAN POTASSIUM 100 MG PO TABS: 100.0000 mg | ORAL_TABLET | Freq: Every day | ORAL | Status: DC

## 2013-06-02 MED ORDER — INDAPAMIDE 2.5 MG PO TABS: 2.5000 mg | ORAL_TABLET | ORAL | Status: DC

## 2013-06-19 ENCOUNTER — Telehealth: Payer: Self-pay | Admitting: *Deleted

## 2013-06-19 MED ORDER — SILDENAFIL CITRATE 100 MG PO TABS: 100.0000 mg | ORAL_TABLET | ORAL | Status: DC | PRN

## 2013-06-19 NOTE — Telephone Encounter (Signed)
Pt called requesting refill on Viagra or Cialis.  Please advise.

## 2013-06-19 NOTE — Telephone Encounter (Signed)
Done. Thx.

## 2013-06-22 NOTE — Telephone Encounter (Signed)
complete

## 2013-06-23 ENCOUNTER — Other Ambulatory Visit: Payer: Self-pay | Admitting: *Deleted

## 2013-06-23 MED ORDER — SILDENAFIL CITRATE 100 MG PO TABS: 100.0000 mg | ORAL_TABLET | ORAL | Status: DC | PRN

## 2013-06-24 ENCOUNTER — Other Ambulatory Visit: Payer: Self-pay | Admitting: *Deleted

## 2013-06-24 ENCOUNTER — Other Ambulatory Visit (INDEPENDENT_AMBULATORY_CARE_PROVIDER_SITE_OTHER): Payer: 59

## 2013-06-24 DIAGNOSIS — E119 Type 2 diabetes mellitus without complications: Secondary | ICD-10-CM

## 2013-06-24 DIAGNOSIS — M545 Low back pain: Secondary | ICD-10-CM

## 2013-06-24 DIAGNOSIS — Z0000 Encounter for general adult medical examination without abnormal findings: Secondary | ICD-10-CM

## 2013-06-24 DIAGNOSIS — I10 Essential (primary) hypertension: Secondary | ICD-10-CM

## 2013-06-24 DIAGNOSIS — F411 Generalized anxiety disorder: Secondary | ICD-10-CM

## 2013-06-24 LAB — HEPATIC FUNCTION PANEL
AST: 26 U/L (ref 0–37)
Albumin: 4 g/dL (ref 3.5–5.2)
Total Bilirubin: 0.8 mg/dL (ref 0.3–1.2)
Total Protein: 7.7 g/dL (ref 6.0–8.3)

## 2013-06-24 LAB — TSH: TSH: 2.47 u[IU]/mL (ref 0.35–5.50)

## 2013-06-24 LAB — BASIC METABOLIC PANEL
Calcium: 9.3 mg/dL (ref 8.4–10.5)
GFR: 80.36 mL/min (ref >60.00)
Glucose, Bld: 95 mg/dL (ref 70–99)
Potassium: 4.1 mEq/L (ref 3.5–5.1)
Sodium: 137 mEq/L (ref 135–145)

## 2013-06-25 LAB — DRUG SCREEN, URINE
Benzodiazepines.: NEGATIVE
Cocaine Metabolites: NEGATIVE
Creatinine,U: 118.04 mg/dL
Opiates: NEGATIVE
Phencyclidine (PCP): NEGATIVE
Propoxyphene: NEGATIVE

## 2013-06-26 LAB — NICOTINE/COTININE METABOLITES: Cotinine: <10 ng/mL

## 2013-07-01 ENCOUNTER — Ambulatory Visit: Payer: BC Managed Care – PPO | Admitting: Internal Medicine

## 2013-07-03 ENCOUNTER — Other Ambulatory Visit: Payer: Self-pay | Admitting: *Deleted

## 2013-07-03 ENCOUNTER — Ambulatory Visit (INDEPENDENT_AMBULATORY_CARE_PROVIDER_SITE_OTHER): Payer: 59 | Admitting: Internal Medicine

## 2013-07-03 ENCOUNTER — Encounter: Payer: Self-pay | Admitting: Internal Medicine

## 2013-07-03 VITALS — BP 120/82 | HR 80 | Temp 98.4°F | Resp 16 | Wt 301.0 lb

## 2013-07-03 DIAGNOSIS — E291 Testicular hypofunction: Secondary | ICD-10-CM

## 2013-07-03 DIAGNOSIS — M545 Low back pain, unspecified: Secondary | ICD-10-CM

## 2013-07-03 DIAGNOSIS — I10 Essential (primary) hypertension: Secondary | ICD-10-CM

## 2013-07-03 DIAGNOSIS — Z0000 Encounter for general adult medical examination without abnormal findings: Secondary | ICD-10-CM

## 2013-07-03 DIAGNOSIS — E119 Type 2 diabetes mellitus without complications: Secondary | ICD-10-CM

## 2013-07-03 NOTE — Assessment & Plan Note (Signed)
I will not be able to prescribe you any controlled substances if abn drug screen. Will re-check drug test in 1wk. If "negative", I can give Ronnie Curtis a "second chance" and stat prescribing again with monthly drug screens

## 2013-07-03 NOTE — Assessment & Plan Note (Signed)
On Clomid 

## 2013-07-03 NOTE — Assessment & Plan Note (Signed)
Continue with current prescription therapy as reflected on the Med list.  

## 2013-07-03 NOTE — Assessment & Plan Note (Signed)
Labs q 4 mo Wt loss

## 2013-07-03 NOTE — Assessment & Plan Note (Signed)
Doing well on diet . Wt Readings from Last 3 Encounters:  07/03/13 301 lb (136.533 kg)  03/18/13 318 lb (144.244 kg)  11/21/12 314 lb (142.429 kg)

## 2013-07-03 NOTE — Progress Notes (Signed)
Subjective:    HPI   The patient presents for a follow-up of  chronic LBP, chronic hypogonadism, HTN previously controlled with medicines. He stopped Maxzide due to cramps - resolved. He is not working now - on [employer].   He is loosing wt on diet 17 lbs   He smoked pot once prior to drug screen - then decided to "detox" himself and did not take oxycodone (he threw it away because he was angry with himself). C/o much more pain w/o pain meds.    Wt Readings from Last 3 Encounters:  07/03/13 301 lb (136.533 kg)  03/18/13 318 lb (144.244 kg)  11/21/12 314 lb (142.429 kg)   BP Readings from Last 3 Encounters:  07/03/13 120/82  03/18/13 130/78  11/21/12 120/84       Review of Systems  Constitutional: Negative for appetite change, fatigue and unexpected weight change.  HENT: Negative for nosebleeds, congestion, sore throat, sneezing, trouble swallowing and neck pain.   Eyes: Negative for itching and visual disturbance.  Respiratory: Negative for cough.   Cardiovascular: Negative for leg swelling.  Gastrointestinal: Negative for diarrhea, blood in stool and abdominal distention.  Genitourinary: Negative for frequency and hematuria.  Musculoskeletal: Negative for joint swelling and gait problem.  Skin: Negative for rash.  Neurological: Negative for tremors and speech difficulty.  Psychiatric/Behavioral: Positive for decreased concentration. Negative for suicidal ideas, behavioral problems, confusion, sleep disturbance, self-injury, dysphoric mood and agitation. The patient is nervous/anxious.        Objective:   Physical Exam  Constitutional: He is oriented to person, place, and time. He appears well-developed.  Very obese  HENT:  Mouth/Throat: Oropharynx is clear and moist.  Eyes: Conjunctivae are normal. Pupils are equal, round, and reactive to light.  Neck: Normal range of motion. No JVD present. No thyromegaly present.  Cardiovascular: Normal rate, regular rhythm, normal  heart sounds and intact distal pulses.  Exam reveals no gallop and no friction rub.   No murmur heard. Pulmonary/Chest: Effort normal and breath sounds normal. No respiratory distress. He has no wheezes. He has no rales. He exhibits no tenderness.  Abdominal: Soft. Bowel sounds are normal. He exhibits no distension and no mass. There is no tenderness. There is no rebound and no guarding.  Musculoskeletal: Normal range of motion. He exhibits tenderness (LS is tender). He exhibits no edema.  R knee is not tender w/ROM  Lymphadenopathy:    He has no cervical adenopathy.  Neurological: He is alert and oriented to person, place, and time. He has normal reflexes. No cranial nerve deficit. He exhibits normal muscle tone. Coordination normal.  Skin: Skin is warm and dry. No rash noted. No erythema.  Psychiatric: His behavior is normal. Judgment and thought content normal.     Lab Results  Component Value Date   WBC 5.2 04/02/2012   HGB 13.2 04/02/2012   HCT 39.3 04/02/2012   PLT 223.0 04/02/2012   GLUCOSE 95 06/24/2013   CHOL 172 04/02/2012   TRIG 129.0 04/02/2012   HDL 42.70 04/02/2012   LDLDIRECT 70.7 01/19/2009   LDLCALC 104* 04/02/2012   ALT 26 06/24/2013   AST 26 06/24/2013   NA 137 06/24/2013   K 4.1 06/24/2013   CL 99 06/24/2013   CREATININE 1.0 06/24/2013   BUN 16 06/24/2013   CO2 27 06/24/2013   TSH 2.47 06/24/2013   PSA 0.78 04/02/2012   HGBA1C 6.2 06/24/2013         Assessment & Plan:

## 2013-07-06 ENCOUNTER — Other Ambulatory Visit (INDEPENDENT_AMBULATORY_CARE_PROVIDER_SITE_OTHER): Payer: 59

## 2013-07-06 DIAGNOSIS — Z0000 Encounter for general adult medical examination without abnormal findings: Secondary | ICD-10-CM

## 2013-07-06 LAB — LIPID PANEL
Cholesterol: 154 mg/dL (ref 0–200)
HDL: 44.5 mg/dL (ref >39.00)
Triglycerides: 175 mg/dL — ABNORMAL HIGH (ref 0.0–149.0)

## 2013-07-09 ENCOUNTER — Telehealth: Payer: Self-pay | Admitting: *Deleted

## 2013-07-09 NOTE — Telephone Encounter (Signed)
Pt called requesting Lipid results.  Please advise

## 2013-07-09 NOTE — Telephone Encounter (Signed)
Normal, except for elevated TG; cont w/wt loss Thx

## 2013-07-10 ENCOUNTER — Other Ambulatory Visit: Payer: 59

## 2013-07-10 DIAGNOSIS — M545 Low back pain, unspecified: Secondary | ICD-10-CM

## 2013-07-10 NOTE — Telephone Encounter (Signed)
Spoke wit pt advised of MDs result note

## 2013-07-11 LAB — DRUG SCREEN, URINE
Benzodiazepines.: NEGATIVE
Cocaine Metabolites: NEGATIVE
Creatinine,U: 425.48 mg/dL
Opiates: NEGATIVE
Phencyclidine (PCP): NEGATIVE
Propoxyphene: NEGATIVE

## 2013-07-17 ENCOUNTER — Encounter: Payer: Self-pay | Admitting: Internal Medicine

## 2013-07-17 ENCOUNTER — Ambulatory Visit (INDEPENDENT_AMBULATORY_CARE_PROVIDER_SITE_OTHER): Payer: 59 | Admitting: Internal Medicine

## 2013-07-17 VITALS — BP 140/88 | HR 83 | Temp 98.3°F | Wt 302.0 lb

## 2013-07-17 DIAGNOSIS — F411 Generalized anxiety disorder: Secondary | ICD-10-CM

## 2013-07-17 DIAGNOSIS — M545 Low back pain: Secondary | ICD-10-CM

## 2013-07-17 MED ORDER — OXYCODONE HCL 15 MG PO TABS: 15.0000 mg | ORAL_TABLET | Freq: Four times a day (QID) | ORAL | Status: DC

## 2013-07-17 NOTE — Assessment & Plan Note (Signed)
Wt loss discussed 

## 2013-07-17 NOTE — Progress Notes (Signed)
Subjective:    HPI   The patient presents for a follow-up of  chronic LBP, chronic hypogonadism, HTN previously controlled with medicines. He stopped Maxzide due to cramps - resolved. He is not working now - on [employer].   He was loosing wt on diet 17 lbs, now gained 1 lb   He smoked pot once prior to previous drug screen - then decided to "detox" himself and did not take oxycodone (he threw it away because he was angry with himself). He hasn't used pot any more. C/o much more pain w/o pain meds.    Wt Readings from Last 3 Encounters:  07/17/13 302 lb (136.986 kg)  07/03/13 301 lb (136.533 kg)  03/18/13 318 lb (144.244 kg)   BP Readings from Last 3 Encounters:  07/17/13 140/88  07/03/13 120/82  03/18/13 130/78       Review of Systems  Constitutional: Negative for appetite change, fatigue and unexpected weight change.  HENT: Negative for nosebleeds, congestion, sore throat, sneezing, trouble swallowing and neck pain.   Eyes: Negative for itching and visual disturbance.  Respiratory: Negative for cough.   Cardiovascular: Negative for leg swelling.  Gastrointestinal: Negative for diarrhea, blood in stool and abdominal distention.  Genitourinary: Negative for frequency and hematuria.  Musculoskeletal: Negative for joint swelling and gait problem.  Skin: Negative for rash.  Neurological: Negative for tremors and speech difficulty.  Psychiatric/Behavioral: Positive for decreased concentration. Negative for suicidal ideas, behavioral problems, confusion, sleep disturbance, self-injury, dysphoric mood and agitation. The patient is nervous/anxious.        Objective:   Physical Exam  Constitutional: He is oriented to person, place, and time. He appears well-developed.  Very obese  HENT:  Mouth/Throat: Oropharynx is clear and moist.  Eyes: Conjunctivae are normal. Pupils are equal, round, and reactive to light.  Neck: Normal range of motion. No JVD present. No thyromegaly  present.  Cardiovascular: Normal rate, regular rhythm, normal heart sounds and intact distal pulses.  Exam reveals no gallop and no friction rub.   No murmur heard. Pulmonary/Chest: Effort normal and breath sounds normal. No respiratory distress. He has no wheezes. He has no rales. He exhibits no tenderness.  Abdominal: Soft. Bowel sounds are normal. He exhibits no distension and no mass. There is no tenderness. There is no rebound and no guarding.  Musculoskeletal: Normal range of motion. He exhibits tenderness (LS is tender). He exhibits no edema.  R knee is not tender w/ROM  Lymphadenopathy:    He has no cervical adenopathy.  Neurological: He is alert and oriented to person, place, and time. He has normal reflexes. No cranial nerve deficit. He exhibits normal muscle tone. Coordination normal.  Skin: Skin is warm and dry. No rash noted. No erythema.  Psychiatric: His behavior is normal. Judgment and thought content normal.     Lab Results  Component Value Date   WBC 5.2 04/02/2012   HGB 13.2 04/02/2012   HCT 39.3 04/02/2012   PLT 223.0 04/02/2012   GLUCOSE 95 06/24/2013   CHOL 154 07/06/2013   TRIG 175.0* 07/06/2013   HDL 44.50 07/06/2013   LDLDIRECT 70.7 01/19/2009   LDLCALC 75 07/06/2013   ALT 26 06/24/2013   AST 26 06/24/2013   NA 137 06/24/2013   K 4.1 06/24/2013   CL 99 06/24/2013   CREATININE 1.0 06/24/2013   BUN 16 06/24/2013   CO2 27 06/24/2013   TSH 2.47 06/24/2013   PSA 0.78 04/02/2012   HGBA1C 6.2 06/24/2013  Assessment & Plan:

## 2013-07-17 NOTE — Assessment & Plan Note (Signed)
Last urine drug screen was negative Rx given Oxy RTC in 1 mo w/drug screen prior

## 2013-07-17 NOTE — Assessment & Plan Note (Signed)
Better - off Xanax

## 2013-08-05 ENCOUNTER — Other Ambulatory Visit: Payer: 59

## 2013-08-05 DIAGNOSIS — M545 Low back pain: Secondary | ICD-10-CM

## 2013-08-06 LAB — DRUG SCREEN, URINE
Amphetamine Screen, Ur: NEGATIVE
Barbiturate Quant, Ur: NEGATIVE
Cocaine Metabolites: NEGATIVE
Creatinine,U: 130.3 mg/dL
Phencyclidine (PCP): NEGATIVE

## 2013-08-17 ENCOUNTER — Encounter: Payer: Self-pay | Admitting: Internal Medicine

## 2013-08-17 ENCOUNTER — Ambulatory Visit (INDEPENDENT_AMBULATORY_CARE_PROVIDER_SITE_OTHER): Payer: 59 | Admitting: Internal Medicine

## 2013-08-17 DIAGNOSIS — M545 Low back pain, unspecified: Secondary | ICD-10-CM

## 2013-08-17 MED ORDER — OXYCODONE HCL 15 MG PO TABS: 15.0000 mg | ORAL_TABLET | Freq: Four times a day (QID) | ORAL | Status: DC

## 2013-08-17 NOTE — Assessment & Plan Note (Signed)
Continue with current prescription therapy as reflected on the Med list. Drug screen

## 2013-08-17 NOTE — Assessment & Plan Note (Signed)
Wt Readings from Last 3 Encounters:  08/17/13 298 lb (135.172 kg)  07/17/13 302 lb (136.986 kg)  07/03/13 301 lb (136.533 kg)

## 2013-08-17 NOTE — Progress Notes (Signed)
Subjective:    HPI   The patient presents for a follow-up of  chronic LBP, chronic hypogonadism, HTN previously controlled with medicines. He stopped Maxzide due to cramps - resolved. He is working now [employer]   He was loosing wt on diet 4 lbs   He smoked pot once prior to previous drug screen - then decided to "detox" himself and did not take oxycodone (he threw it away because he was angry with himself). He hasn't used pot any more. C/o much more pain w/o pain meds.    Wt Readings from Last 3 Encounters:  08/17/13 298 lb (135.172 kg)  07/17/13 302 lb (136.986 kg)  07/03/13 301 lb (136.533 kg)   BP Readings from Last 3 Encounters:  08/17/13 158/92  07/17/13 140/88  07/03/13 120/82       Review of Systems  Constitutional: Negative for appetite change, fatigue and unexpected weight change.  HENT: Negative for nosebleeds, congestion, sore throat, sneezing, trouble swallowing and neck pain.   Eyes: Negative for itching and visual disturbance.  Respiratory: Negative for cough.   Cardiovascular: Negative for leg swelling.  Gastrointestinal: Negative for diarrhea, blood in stool and abdominal distention.  Genitourinary: Negative for frequency and hematuria.  Musculoskeletal: Negative for joint swelling and gait problem.  Skin: Negative for rash.  Neurological: Negative for tremors and speech difficulty.  Psychiatric/Behavioral: Positive for decreased concentration. Negative for suicidal ideas, behavioral problems, confusion, sleep disturbance, self-injury, dysphoric mood and agitation. The patient is nervous/anxious.        Objective:   Physical Exam  Constitutional: He is oriented to person, place, and time. He appears well-developed.  Very obese  HENT:  Mouth/Throat: Oropharynx is clear and moist.  Eyes: Conjunctivae are normal. Pupils are equal, round, and reactive to light.  Neck: Normal range of motion. No JVD present. No thyromegaly present.   Cardiovascular: Normal rate, regular rhythm, normal heart sounds and intact distal pulses.  Exam reveals no gallop and no friction rub.   No murmur heard. Pulmonary/Chest: Effort normal and breath sounds normal. No respiratory distress. He has no wheezes. He has no rales. He exhibits no tenderness.  Abdominal: Soft. Bowel sounds are normal. He exhibits no distension and no mass. There is no tenderness. There is no rebound and no guarding.  Musculoskeletal: Normal range of motion. He exhibits tenderness (LS is tender). He exhibits no edema.  R knee is not tender w/ROM  Lymphadenopathy:    He has no cervical adenopathy.  Neurological: He is alert and oriented to person, place, and time. He has normal reflexes. No cranial nerve deficit. He exhibits normal muscle tone. Coordination normal.  Skin: Skin is warm and dry. No rash noted. No erythema.  Psychiatric: His behavior is normal. Judgment and thought content normal.     Lab Results  Component Value Date   WBC 5.2 04/02/2012   HGB 13.2 04/02/2012   HCT 39.3 04/02/2012   PLT 223.0 04/02/2012   GLUCOSE 95 06/24/2013   CHOL 154 07/06/2013   TRIG 175.0* 07/06/2013   HDL 44.50 07/06/2013   LDLDIRECT 70.7 01/19/2009   LDLCALC 75 07/06/2013   ALT 26 06/24/2013   AST 26 06/24/2013   NA 137 06/24/2013   K 4.1 06/24/2013   CL 99 06/24/2013   CREATININE 1.0 06/24/2013   BUN 16 06/24/2013   CO2 27 06/24/2013   TSH 2.47 06/24/2013   PSA 0.78 04/02/2012   HGBA1C 6.2 06/24/2013  Assessment & Plan:

## 2013-08-19 ENCOUNTER — Telehealth: Payer: Self-pay | Admitting: *Deleted

## 2013-08-19 MED ORDER — KETOCONAZOLE 2 % EX CREA: TOPICAL_CREAM | Freq: Every day | CUTANEOUS | Status: DC

## 2013-08-19 NOTE — Telephone Encounter (Signed)
Ok Done Thx 

## 2013-08-19 NOTE — Telephone Encounter (Signed)
Pt called states he forgot to request Rx for Jock Itch.  Please advise

## 2013-08-20 NOTE — Telephone Encounter (Signed)
Spoke with pt advised Rx sent 

## 2013-09-02 ENCOUNTER — Other Ambulatory Visit: Payer: 59

## 2013-09-02 DIAGNOSIS — M545 Low back pain: Secondary | ICD-10-CM

## 2013-09-03 LAB — DRUG SCREEN, URINE
Amphetamine Screen, Ur: NEGATIVE
Benzodiazepines.: NEGATIVE
Cocaine Metabolites: NEGATIVE
Marijuana Metabolite: NEGATIVE
Phencyclidine (PCP): NEGATIVE

## 2013-09-04 ENCOUNTER — Telehealth: Payer: Self-pay

## 2013-09-04 NOTE — Telephone Encounter (Signed)
Patient is returning you call.

## 2013-09-15 ENCOUNTER — Encounter: Payer: Self-pay | Admitting: Internal Medicine

## 2013-09-15 ENCOUNTER — Ambulatory Visit: Payer: 59 | Admitting: *Deleted

## 2013-09-15 ENCOUNTER — Ambulatory Visit (INDEPENDENT_AMBULATORY_CARE_PROVIDER_SITE_OTHER): Payer: 59 | Admitting: Internal Medicine

## 2013-09-15 VITALS — BP 150/100 | HR 80 | Temp 98.7°F | Resp 16 | Wt 298.0 lb

## 2013-09-15 DIAGNOSIS — M545 Low back pain: Secondary | ICD-10-CM

## 2013-09-15 NOTE — Progress Notes (Signed)
Subjective:    HPI   Last drug screen was neg. Pt states he is taking his Rx. He took it this am as well - ?samples wee mixed up The patient presents for a follow-up of  chronic LBP, chronic hypogonadism, HTN previously controlled with medicines. He stopped Maxzide due to cramps - resolved. He is working now [employer]   He was loosing wt on diet 4 lbs   He smoked pot once prior to previous drug screen - then decided to "detox" himself and did not take oxycodone (he threw it away because he was angry with himself). He hasn't used pot any more. C/o much more pain w/o pain meds.    Wt Readings from Last 3 Encounters:  09/15/13 298 lb (135.172 kg)  08/17/13 298 lb (135.172 kg)  07/17/13 302 lb (136.986 kg)   BP Readings from Last 3 Encounters:  09/15/13 150/100  08/17/13 158/92  07/17/13 140/88       Review of Systems  Constitutional: Negative for appetite change, fatigue and unexpected weight change.  HENT: Negative for congestion, nosebleeds, sneezing, sore throat and trouble swallowing.   Eyes: Negative for itching and visual disturbance.  Respiratory: Negative for cough.   Cardiovascular: Negative for leg swelling.  Gastrointestinal: Negative for diarrhea, blood in stool and abdominal distention.  Genitourinary: Negative for frequency and hematuria.  Musculoskeletal: Negative for gait problem, joint swelling and neck pain.  Skin: Negative for rash.  Neurological: Negative for tremors and speech difficulty.  Psychiatric/Behavioral: Positive for decreased concentration. Negative for suicidal ideas, behavioral problems, confusion, sleep disturbance, self-injury, dysphoric mood and agitation. The patient is nervous/anxious.        Objective:   Physical Exam  Constitutional: He is oriented to person, place, and time. He appears well-developed.  Very obese  HENT:  Mouth/Throat: Oropharynx is clear and moist.  Eyes: Conjunctivae are normal. Pupils are equal,  round, and reactive to light.  Neck: Normal range of motion. No JVD present. No thyromegaly present.  Cardiovascular: Normal rate, regular rhythm, normal heart sounds and intact distal pulses.  Exam reveals no gallop and no friction rub.   No murmur heard. Pulmonary/Chest: Effort normal and breath sounds normal. No respiratory distress. He has no wheezes. He has no rales. He exhibits no tenderness.  Abdominal: Soft. Bowel sounds are normal. He exhibits no distension and no mass. There is no tenderness. There is no rebound and no guarding.  Musculoskeletal: Normal range of motion. He exhibits tenderness (LS is tender). He exhibits no edema.  R knee is not tender w/ROM  Lymphadenopathy:    He has no cervical adenopathy.  Neurological: He is alert and oriented to person, place, and time. He has normal reflexes. No cranial nerve deficit. He exhibits normal muscle tone. Coordination normal.  Skin: Skin is warm and dry. No rash noted. No erythema.  Psychiatric: His behavior is normal. Judgment and thought content normal.     Lab Results  Component Value Date   WBC 5.2 04/02/2012   HGB 13.2 04/02/2012   HCT 39.3 04/02/2012   PLT 223.0 04/02/2012   GLUCOSE 95 06/24/2013   CHOL 154 07/06/2013   TRIG 175.0* 07/06/2013   HDL 44.50 07/06/2013   LDLDIRECT 70.7 01/19/2009   LDLCALC 75 07/06/2013   ALT 26 06/24/2013   AST 26 06/24/2013   NA 137 06/24/2013   K 4.1 06/24/2013   CL 99 06/24/2013   CREATININE 1.0 06/24/2013   BUN 16 06/24/2013  CO2 27 06/24/2013   TSH 2.47 06/24/2013   PSA 0.78 04/02/2012   HGBA1C 6.2 06/24/2013         Assessment & Plan:

## 2013-09-15 NOTE — Progress Notes (Signed)
Pre-visit discussion using our clinic review tool. No additional management support is needed unless otherwise documented below in the visit note.  

## 2013-09-15 NOTE — Assessment & Plan Note (Signed)
Neg drug screen discussed - see note - ?error Repeat screen today

## 2013-09-16 ENCOUNTER — Telehealth: Payer: Self-pay | Admitting: *Deleted

## 2013-09-16 ENCOUNTER — Other Ambulatory Visit: Payer: Self-pay | Admitting: *Deleted

## 2013-09-16 LAB — DRUG SCREEN, URINE
Amphetamine Screen, Ur: NEGATIVE
Benzodiazepines.: POSITIVE — AB
Cocaine Metabolites: NEGATIVE
Marijuana Metabolite: NEGATIVE
Opiates: POSITIVE — AB
Phencyclidine (PCP): NEGATIVE
Propoxyphene: NEGATIVE

## 2013-09-16 IMAGING — CR DG THORACIC SPINE 2V
3 series · 3 of 3 positions shown · non-contrast
Comparison: Chest radiograph 02/02/2009

CLINICAL DATA: Thoracic back pain

THORACIC SPINE - 2 VIEW

[view not recorded (1 of 3)]
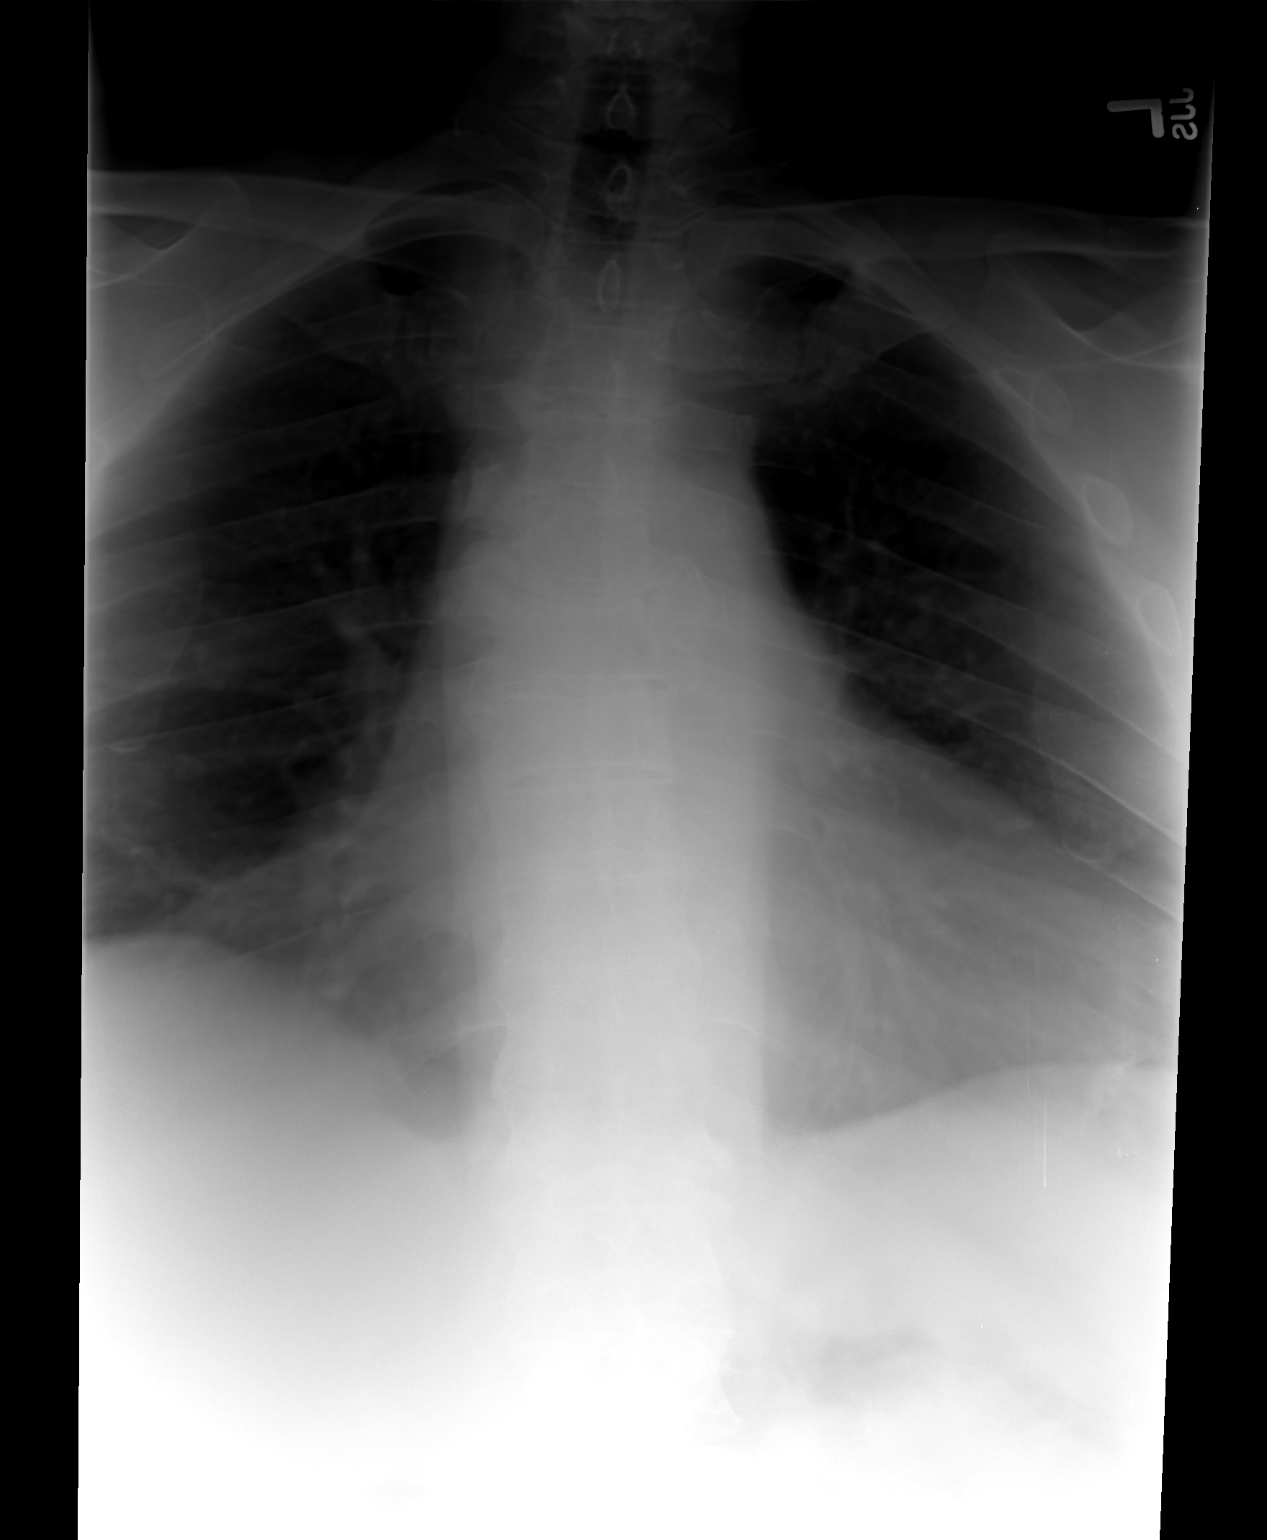

[view not recorded (2 of 3)]
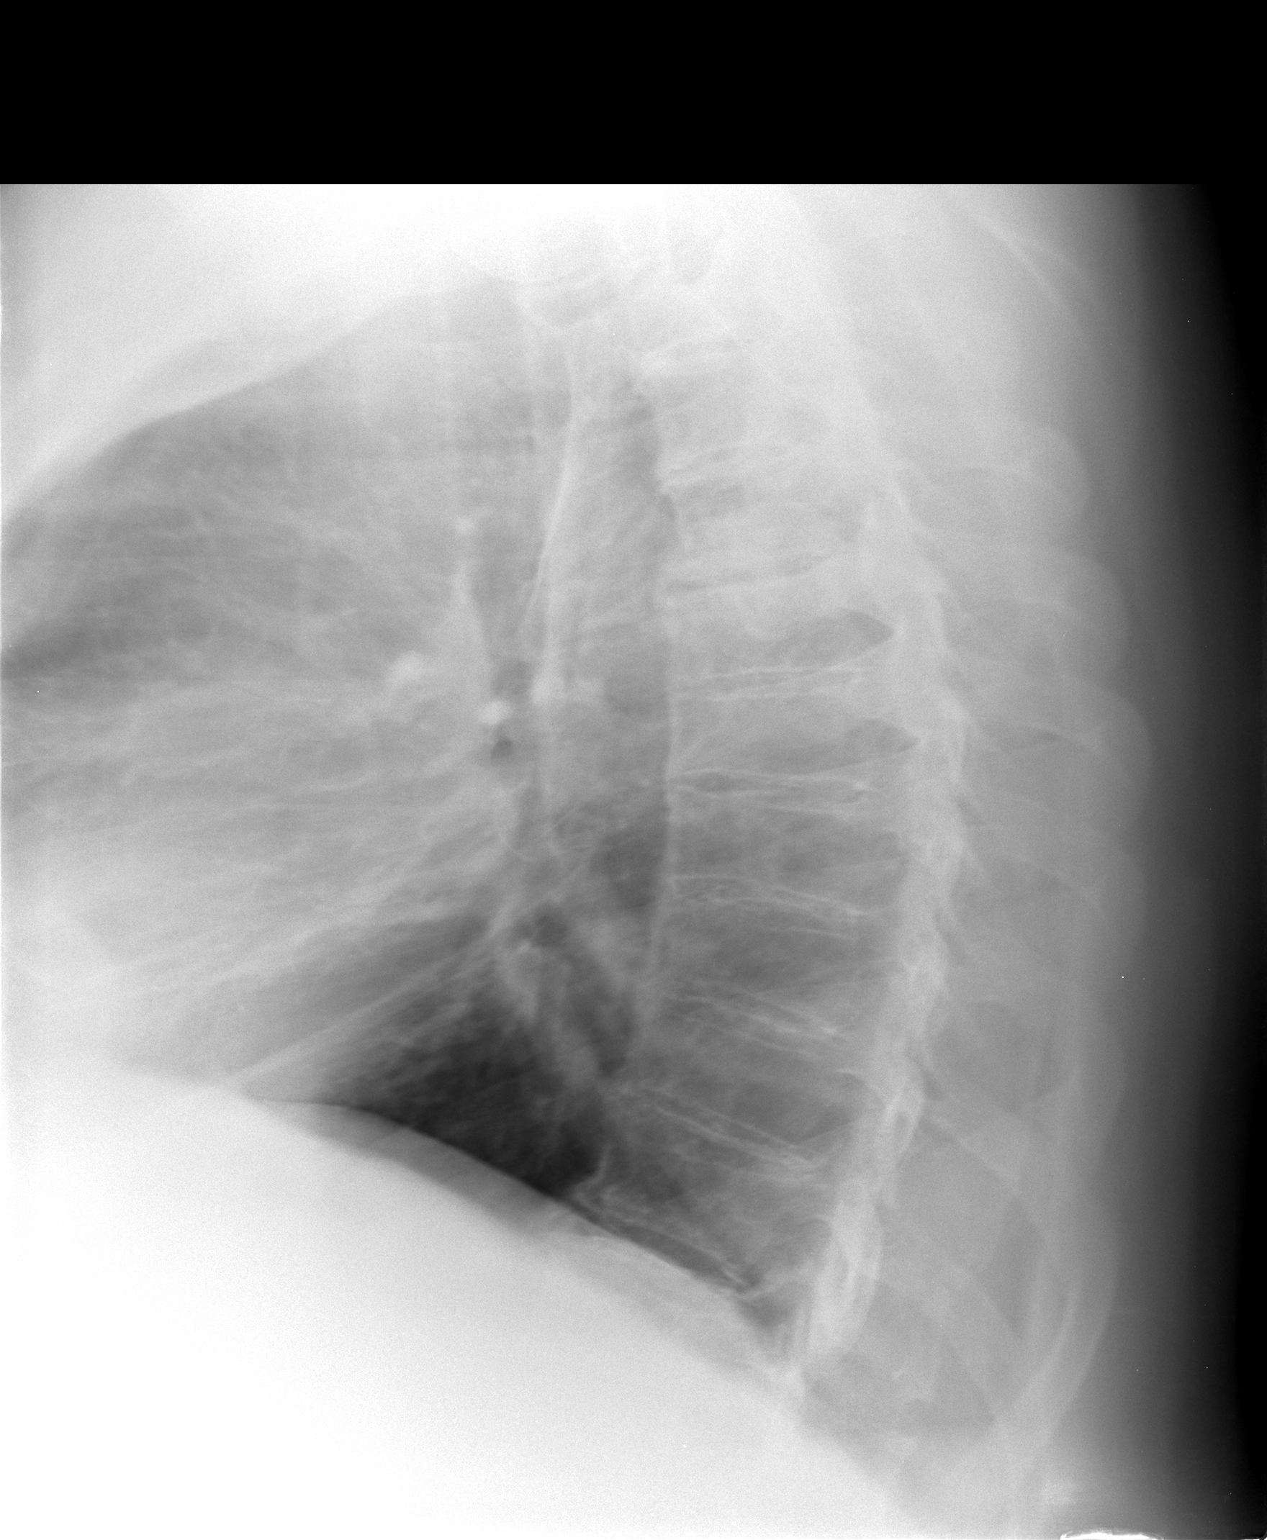

[view not recorded (3 of 3)]
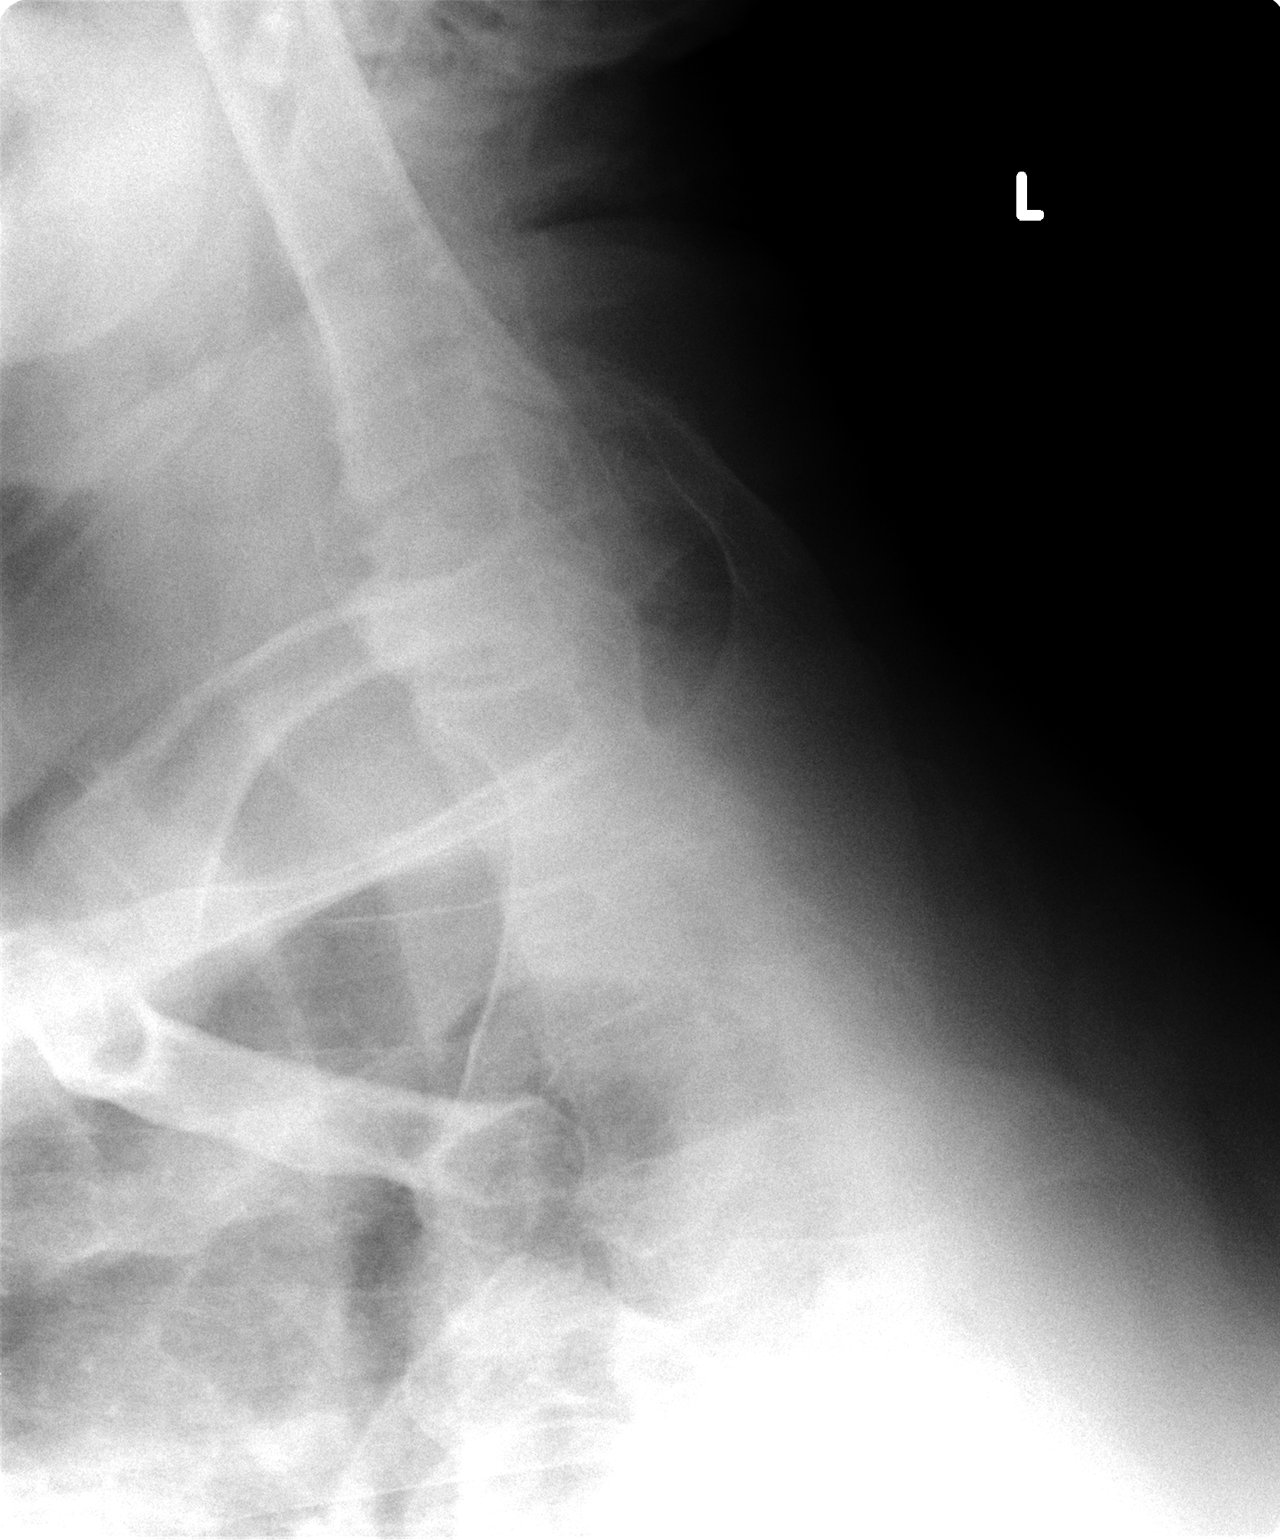

[3 of 3 positions shown; findings below may reference images not displayed]

FINDINGS: Bones demineralized.
Minimal broad-based dextroconvex thoracic scoliosis.
12 pairs of ribs are visible.
Slight motion artifact on lateral view.
Calcification of the anterior longitudinal ligament at the mid to
lower thoracic spine.
No acute fracture, subluxation or bone destruction.
IMPRESSION: No definite acute abnormalities.

## 2013-09-16 MED ORDER — OXYCODONE HCL 15 MG PO TABS: 15.0000 mg | ORAL_TABLET | Freq: Four times a day (QID) | ORAL | Status: DC

## 2013-09-16 NOTE — Addendum Note (Signed)
Addended by: Merrilyn Puma on: 09/16/2013 01:12 PM   Modules accepted: Orders

## 2013-09-16 NOTE — Telephone Encounter (Signed)
Pt picked up Rx--he states fill on date should be 09/16/13. Ok per MD to correct. Updated rx printed/signed/given to pt.

## 2013-09-16 NOTE — Telephone Encounter (Signed)
Pt's Oxycodone 15 mg Rx is ready for p/u. Pt informed to schedule OV in 4 weeks and transferred to scheduler.

## 2013-09-28 ENCOUNTER — Other Ambulatory Visit: Payer: Self-pay | Admitting: *Deleted

## 2013-09-28 MED ORDER — CLOMIPHENE CITRATE 50 MG PO TABS: 25.0000 mg | ORAL_TABLET | Freq: Every day | ORAL | Status: DC

## 2013-10-05 ENCOUNTER — Other Ambulatory Visit: Payer: 59

## 2013-10-05 DIAGNOSIS — M545 Low back pain: Secondary | ICD-10-CM

## 2013-10-06 LAB — DRUG SCREEN, URINE
Barbiturate Quant, Ur: NEGATIVE
Benzodiazepines.: NEGATIVE
Cocaine Metabolites: NEGATIVE
Creatinine,U: 57.49 mg/dL
Phencyclidine (PCP): NEGATIVE

## 2013-10-15 ENCOUNTER — Encounter: Payer: Self-pay | Admitting: Internal Medicine

## 2013-10-15 ENCOUNTER — Ambulatory Visit (INDEPENDENT_AMBULATORY_CARE_PROVIDER_SITE_OTHER): Payer: 59 | Admitting: Internal Medicine

## 2013-10-15 VITALS — BP 142/90 | HR 76 | Temp 98.6°F | Resp 16 | Wt 298.0 lb

## 2013-10-15 DIAGNOSIS — M545 Low back pain, unspecified: Secondary | ICD-10-CM

## 2013-10-15 DIAGNOSIS — E119 Type 2 diabetes mellitus without complications: Secondary | ICD-10-CM

## 2013-10-15 DIAGNOSIS — I10 Essential (primary) hypertension: Secondary | ICD-10-CM

## 2013-10-15 MED ORDER — OXYCODONE HCL 15 MG PO TABS: 15.0000 mg | ORAL_TABLET | Freq: Four times a day (QID) | ORAL | Status: DC

## 2013-10-15 NOTE — Progress Notes (Signed)
Subjective:    HPI    The patient presents for a follow-up of  chronic LBP, chronic hypogonadism, HTN previously controlled with medicines. He stopped Maxzide due to cramps - resolved. He was working at [employer] - now he quit and he is retired again.   He smoked pot once prior to previous drug screen - then decided to "detox" himself and did not take oxycodone (he threw it away because he was angry with himself). He hasn't used pot any more. C/o much more pain w/o pain meds.    Wt Readings from Last 3 Encounters:  10/15/13 298 lb (135.172 kg)  09/15/13 298 lb (135.172 kg)  08/17/13 298 lb (135.172 kg)   BP Readings from Last 3 Encounters:  10/15/13 142/90  09/15/13 150/100  08/17/13 158/92       Review of Systems  Constitutional: Negative for appetite change, fatigue and unexpected weight change.  HENT: Negative for congestion, nosebleeds, sneezing, sore throat and trouble swallowing.   Eyes: Negative for itching and visual disturbance.  Respiratory: Negative for cough.   Cardiovascular: Negative for leg swelling.  Gastrointestinal: Negative for diarrhea, blood in stool and abdominal distention.  Genitourinary: Negative for frequency and hematuria.  Musculoskeletal: Negative for gait problem, joint swelling and neck pain.  Skin: Negative for rash.  Neurological: Negative for tremors and speech difficulty.  Psychiatric/Behavioral: Positive for decreased concentration. Negative for suicidal ideas, behavioral problems, confusion, sleep disturbance, self-injury, dysphoric mood and agitation. The patient is nervous/anxious.        Objective:   Physical Exam  Constitutional: He is oriented to person, place, and time. He appears well-developed.  Very obese  HENT:  Mouth/Throat: Oropharynx is clear and moist.  Eyes: Conjunctivae are normal. Pupils are equal, round, and reactive to light.  Neck: Normal range of motion. No JVD present. No thyromegaly present.   Cardiovascular: Normal rate, regular rhythm, normal heart sounds and intact distal pulses.  Exam reveals no gallop and no friction rub.   No murmur heard. Pulmonary/Chest: Effort normal and breath sounds normal. No respiratory distress. He has no wheezes. He has no rales. He exhibits no tenderness.  Abdominal: Soft. Bowel sounds are normal. He exhibits no distension and no mass. There is no tenderness. There is no rebound and no guarding.  Musculoskeletal: Normal range of motion. He exhibits tenderness (LS is tender). He exhibits no edema.  R knee is not tender w/ROM  Lymphadenopathy:    He has no cervical adenopathy.  Neurological: He is alert and oriented to person, place, and time. He has normal reflexes. No cranial nerve deficit. He exhibits normal muscle tone. Coordination normal.  Skin: Skin is warm and dry. No rash noted. No erythema.  Psychiatric: His behavior is normal. Judgment and thought content normal.     Lab Results  Component Value Date   WBC 5.2 04/02/2012   HGB 13.2 04/02/2012   HCT 39.3 04/02/2012   PLT 223.0 04/02/2012   GLUCOSE 95 06/24/2013   CHOL 154 07/06/2013   TRIG 175.0* 07/06/2013   HDL 44.50 07/06/2013   LDLDIRECT 70.7 01/19/2009   LDLCALC 75 07/06/2013   ALT 26 06/24/2013   AST 26 06/24/2013   NA 137 06/24/2013   K 4.1 06/24/2013   CL 99 06/24/2013   CREATININE 1.0 06/24/2013   BUN 16 06/24/2013   CO2 27 06/24/2013   TSH 2.47 06/24/2013   PSA 0.78 04/02/2012   HGBA1C 6.2 06/24/2013  Assessment & Plan:

## 2013-10-15 NOTE — Assessment & Plan Note (Signed)
Wt Readings from Last 3 Encounters:  10/15/13 298 lb (135.172 kg)  09/15/13 298 lb (135.172 kg)  08/17/13 298 lb (135.172 kg)

## 2013-10-15 NOTE — Assessment & Plan Note (Signed)
Continue with current prescription therapy as reflected on the Med list.  

## 2013-10-15 NOTE — Progress Notes (Signed)
Pre visit review using our clinic review tool, if applicable. No additional management support is needed unless otherwise documented below in the visit note. 

## 2013-10-19 ENCOUNTER — Telehealth: Payer: Self-pay

## 2013-10-19 MED ORDER — OXYCODONE HCL 15 MG PO TABS: 15.0000 mg | ORAL_TABLET | Freq: Four times a day (QID) | ORAL | Status: DC

## 2013-10-19 NOTE — Telephone Encounter (Signed)
Ok to reproduce Jan Rx Thx

## 2013-10-19 NOTE — Telephone Encounter (Signed)
Script has been printed and awaiting sig

## 2013-10-19 NOTE — Telephone Encounter (Signed)
Phone call from patient [phone] stating her accidentally threw away his Jan script for Oxycodone along with his other papers. He is requesting a new script. Please advise.

## 2013-10-19 NOTE — Telephone Encounter (Signed)
Patient will be notified when ready to pick up

## 2013-11-24 ENCOUNTER — Telehealth: Payer: Self-pay | Admitting: *Deleted

## 2013-11-24 MED ORDER — FLUTICASONE PROPIONATE 50 MCG/ACT NA SUSP: 2.0000 | Freq: Every day | NASAL | Status: DC

## 2013-11-24 NOTE — Telephone Encounter (Signed)
Refill done.  

## 2013-12-07 ENCOUNTER — Other Ambulatory Visit: Payer: 59

## 2013-12-07 DIAGNOSIS — M545 Low back pain, unspecified: Secondary | ICD-10-CM

## 2013-12-08 ENCOUNTER — Emergency Department: Payer: Self-pay | Admitting: Emergency Medicine

## 2013-12-08 LAB — DRUG SCREEN, URINE
Amphetamine Screen, Ur: NEGATIVE
BARBITURATE QUANT UR: NEGATIVE
Benzodiazepines.: NEGATIVE
COCAINE METABOLITES: NEGATIVE
CREATININE, U: 60.44 mg/dL
MARIJUANA METABOLITE: NEGATIVE
Methadone: NEGATIVE
OPIATES: POSITIVE — AB
Phencyclidine (PCP): NEGATIVE
Propoxyphene: NEGATIVE

## 2013-12-16 ENCOUNTER — Encounter: Payer: Self-pay | Admitting: Internal Medicine

## 2013-12-16 ENCOUNTER — Ambulatory Visit (INDEPENDENT_AMBULATORY_CARE_PROVIDER_SITE_OTHER): Payer: 59 | Admitting: Internal Medicine

## 2013-12-16 ENCOUNTER — Encounter: Payer: Self-pay | Admitting: *Deleted

## 2013-12-16 VITALS — BP 130/90 | HR 76 | Temp 98.8°F | Resp 16 | Wt 272.0 lb

## 2013-12-16 DIAGNOSIS — I10 Essential (primary) hypertension: Secondary | ICD-10-CM

## 2013-12-16 DIAGNOSIS — S61409A Unspecified open wound of unspecified hand, initial encounter: Secondary | ICD-10-CM

## 2013-12-16 DIAGNOSIS — E119 Type 2 diabetes mellitus without complications: Secondary | ICD-10-CM

## 2013-12-16 DIAGNOSIS — S61412A Laceration without foreign body of left hand, initial encounter: Secondary | ICD-10-CM | POA: Insufficient documentation

## 2013-12-16 DIAGNOSIS — M545 Low back pain, unspecified: Secondary | ICD-10-CM

## 2013-12-16 MED ORDER — OXYCODONE HCL 15 MG PO TABS: 15.0000 mg | ORAL_TABLET | Freq: Four times a day (QID) | ORAL | Status: DC

## 2013-12-16 NOTE — Assessment & Plan Note (Signed)
Continue with current prescription therapy as reflected on the Med list.  

## 2013-12-16 NOTE — Progress Notes (Signed)
Pre visit review using our clinic review tool, if applicable. No additional management support is needed unless otherwise documented below in the visit note. 

## 2013-12-16 NOTE — Progress Notes (Signed)
Subjective:    HPI    The patient presents for a follow-up of  chronic LBP, chronic hypogonadism, HTN previously controlled with medicines. He stopped Maxzide due to cramps - resolved. He was working at [employer] - now he quit and he is retired again.   He smoked pot once prior to previous drug screen - then decided to "detox" himself and did not take oxycodone (he threw it away because he was angry with himself). He hasn't used pot any more. C/o much more pain w/o pain meds.    Wt Readings from Last 3 Encounters:  12/16/13 272 lb (123.378 kg)  10/15/13 298 lb (135.172 kg)  09/15/13 298 lb (135.172 kg)   BP Readings from Last 3 Encounters:  12/16/13 130/90  10/15/13 142/90  09/15/13 150/100       Review of Systems  Constitutional: Negative for appetite change, fatigue and unexpected weight change.  HENT: Negative for congestion, nosebleeds, sneezing, sore throat and trouble swallowing.   Eyes: Negative for itching and visual disturbance.  Respiratory: Negative for cough.   Cardiovascular: Negative for leg swelling.  Gastrointestinal: Negative for diarrhea, blood in stool and abdominal distention.  Genitourinary: Negative for frequency and hematuria.  Musculoskeletal: Negative for gait problem, joint swelling and neck pain.  Skin: Negative for rash.  Neurological: Negative for tremors and speech difficulty.  Psychiatric/Behavioral: Positive for decreased concentration. Negative for suicidal ideas, behavioral problems, confusion, sleep disturbance, self-injury, dysphoric mood and agitation. The patient is nervous/anxious.        Objective:   Physical Exam  Constitutional: He is oriented to person, place, and time. He appears well-developed.  Very obese  HENT:  Mouth/Throat: Oropharynx is clear and moist.  Eyes: Conjunctivae are normal. Pupils are equal, round, and reactive to light.  Neck: Normal range of motion. No JVD present. No thyromegaly present.   Cardiovascular: Normal rate, regular rhythm, normal heart sounds and intact distal pulses.  Exam reveals no gallop and no friction rub.   No murmur heard. Pulmonary/Chest: Effort normal and breath sounds normal. No respiratory distress. He has no wheezes. He has no rales. He exhibits no tenderness.  Abdominal: Soft. Bowel sounds are normal. He exhibits no distension and no mass. There is no tenderness. There is no rebound and no guarding.  Musculoskeletal: Normal range of motion. He exhibits tenderness (LS is tender). He exhibits no edema.  R knee is not tender w/ROM  Lymphadenopathy:    He has no cervical adenopathy.  Neurological: He is alert and oriented to person, place, and time. He has normal reflexes. No cranial nerve deficit. He exhibits normal muscle tone. Coordination normal.  Skin: Skin is warm and dry. No rash noted. No erythema.  Psychiatric: His behavior is normal. Judgment and thought content normal.     Subjective:    HPI    The patient presents for a follow-up of  chronic LBP, chronic hypogonadism, HTN previously controlled with medicines. He stopped Maxzide due to cramps - resolved. He was working at [employer] - now he quit and he is retired again.   He smoked pot once prior to previous drug screen - then decided to "detox" himself and did not take oxycodone (he threw it away because he was angry with himself). He hasn't used pot any more. C/o much more pain w/o pain meds.    Wt Readings from Last 3 Encounters:  12/16/13 272 lb (123.378 kg)  10/15/13 298 lb (135.172 kg)  09/15/13 298  lb (135.172 kg)   BP Readings from Last 3 Encounters:  12/16/13 130/90  10/15/13 142/90  09/15/13 150/100       Review of Systems  Constitutional: Negative for appetite change, fatigue and unexpected weight change.  HENT: Negative for congestion, nosebleeds, sneezing, sore throat and trouble swallowing.   Eyes: Negative for itching and visual disturbance.   Respiratory: Negative for cough.   Cardiovascular: Negative for leg swelling.  Gastrointestinal: Negative for diarrhea, blood in stool and abdominal distention.  Genitourinary: Negative for frequency and hematuria.  Musculoskeletal: Negative for gait problem, joint swelling and neck pain.  Skin: Negative for rash.  Neurological: Negative for tremors and speech difficulty.  Psychiatric/Behavioral: Positive for decreased concentration. Negative for suicidal ideas, behavioral problems, confusion, sleep disturbance, self-injury, dysphoric mood and agitation. The patient is nervous/anxious.        Objective:   Physical Exam  Constitutional: He is oriented to person, place, and time. He appears well-developed.  Very obese  HENT:  Mouth/Throat: Oropharynx is clear and moist.  Eyes: Conjunctivae are normal. Pupils are equal, round, and reactive to light.  Neck: Normal range of motion. No JVD present. No thyromegaly present.  Cardiovascular: Normal rate, regular rhythm, normal heart sounds and intact distal pulses.  Exam reveals no gallop and no friction rub.   No murmur heard. Pulmonary/Chest: Effort normal and breath sounds normal. No respiratory distress. He has no wheezes. He has no rales. He exhibits no tenderness.  Abdominal: Soft. Bowel sounds are normal. He exhibits no distension and no mass. There is no tenderness. There is no rebound and no guarding.  Musculoskeletal: Normal range of motion. He exhibits tenderness (LS is tender). He exhibits no edema.  R knee is not tender w/ROM  Lymphadenopathy:    He has no cervical adenopathy.  Neurological: He is alert and oriented to person, place, and time. He has normal reflexes. No cranial nerve deficit. He exhibits normal muscle tone. Coordination normal.  Skin: Skin is warm and dry. No rash noted. No erythema.  Psychiatric: His behavior is normal. Judgment and thought content normal.     Lab Results  Component Value Date   WBC 5.2  04/02/2012   HGB 13.2 04/02/2012   HCT 39.3 04/02/2012   PLT 223.0 04/02/2012   GLUCOSE 95 06/24/2013   CHOL 154 07/06/2013   TRIG 175.0* 07/06/2013   HDL 44.50 07/06/2013   LDLDIRECT 70.7 01/19/2009   LDLCALC 75 07/06/2013   ALT 26 06/24/2013   AST 26 06/24/2013   NA 137 06/24/2013   K 4.1 06/24/2013   CL 99 06/24/2013   CREATININE 1.0 06/24/2013   BUN 16 06/24/2013   CO2 27 06/24/2013   TSH 2.47 06/24/2013   PSA 0.78 04/02/2012   HGBA1C 6.2 06/24/2013         Assessment & Plan:     Lab Results  Component Value Date   WBC 5.2 04/02/2012   HGB 13.2 04/02/2012   HCT 39.3 04/02/2012   PLT 223.0 04/02/2012   GLUCOSE 95 06/24/2013   CHOL 154 07/06/2013   TRIG 175.0* 07/06/2013   HDL 44.50 07/06/2013   LDLDIRECT 70.7 01/19/2009   LDLCALC 75 07/06/2013   ALT 26 06/24/2013   AST 26 06/24/2013   NA 137 06/24/2013   K 4.1 06/24/2013   CL 99 06/24/2013   CREATININE 1.0 06/24/2013   BUN 16 06/24/2013   CO2 27 06/24/2013   TSH 2.47 06/24/2013   PSA 0.78 04/02/2012   HGBA1C 6.2  06/24/2013         Assessment & Plan:

## 2013-12-16 NOTE — Assessment & Plan Note (Signed)
On a Z diet Wt Readings from Last 3 Encounters:  12/16/13 272 lb (123.378 kg)  10/15/13 298 lb (135.172 kg)  09/15/13 298 lb (135.172 kg)

## 2013-12-17 ENCOUNTER — Encounter: Payer: Self-pay | Admitting: Internal Medicine

## 2013-12-18 ENCOUNTER — Telehealth: Payer: Self-pay

## 2013-12-18 NOTE — Telephone Encounter (Signed)
Relevant patient education assigned to patient using Emmi. ° °

## 2013-12-30 ENCOUNTER — Other Ambulatory Visit: Payer: Self-pay | Admitting: *Deleted

## 2013-12-30 MED ORDER — FLUTICASONE PROPIONATE 50 MCG/ACT NA SUSP: 2.0000 | Freq: Every day | NASAL | Status: DC

## 2013-12-30 MED ORDER — CLOMIPHENE CITRATE 50 MG PO TABS: 25.0000 mg | ORAL_TABLET | Freq: Every day | ORAL | Status: DC

## 2013-12-31 ENCOUNTER — Telehealth: Payer: Self-pay | Admitting: *Deleted

## 2014-01-12 ENCOUNTER — Ambulatory Visit: Payer: 59 | Admitting: Internal Medicine

## 2014-02-17 ENCOUNTER — Other Ambulatory Visit: Payer: 59

## 2014-02-17 DIAGNOSIS — M545 Low back pain, unspecified: Secondary | ICD-10-CM

## 2014-02-18 LAB — DRUG SCREEN, URINE
AMPHETAMINE SCRN UR: NEGATIVE
BARBITURATE QUANT UR: NEGATIVE
Benzodiazepines.: NEGATIVE
Cocaine Metabolites: NEGATIVE
Creatinine,U: 69.42 mg/dL
METHADONE: NEGATIVE
Marijuana Metabolite: POSITIVE — AB
OPIATES: NEGATIVE
Phencyclidine (PCP): NEGATIVE
Propoxyphene: NEGATIVE

## 2014-02-22 ENCOUNTER — Telehealth: Payer: Self-pay | Admitting: *Deleted

## 2014-02-22 NOTE — Telephone Encounter (Signed)
Pls see Alisha for UDS Thx

## 2014-02-22 NOTE — Telephone Encounter (Signed)
Pt left vm stating this is the second time [facility] has reported marijuana in his system and no opiates. He states this is not valid and he wants to do another UDS at [place]. Please advise.

## 2014-02-25 NOTE — Telephone Encounter (Signed)
Pt informed and agrees.

## 2014-03-16 ENCOUNTER — Encounter: Payer: 59 | Admitting: Internal Medicine

## 2014-03-16 ENCOUNTER — Encounter: Payer: Self-pay | Admitting: Internal Medicine

## 2014-03-16 ENCOUNTER — Ambulatory Visit (INDEPENDENT_AMBULATORY_CARE_PROVIDER_SITE_OTHER): Payer: 59 | Admitting: Internal Medicine

## 2014-03-16 ENCOUNTER — Ambulatory Visit: Payer: 59 | Admitting: Internal Medicine

## 2014-03-16 DIAGNOSIS — F3289 Other specified depressive episodes: Secondary | ICD-10-CM

## 2014-03-16 DIAGNOSIS — M545 Low back pain, unspecified: Secondary | ICD-10-CM

## 2014-03-16 DIAGNOSIS — F329 Major depressive disorder, single episode, unspecified: Secondary | ICD-10-CM

## 2014-03-16 DIAGNOSIS — I10 Essential (primary) hypertension: Secondary | ICD-10-CM

## 2014-03-16 DIAGNOSIS — E119 Type 2 diabetes mellitus without complications: Secondary | ICD-10-CM

## 2014-03-16 DIAGNOSIS — K219 Gastro-esophageal reflux disease without esophagitis: Secondary | ICD-10-CM

## 2014-03-16 DIAGNOSIS — E291 Testicular hypofunction: Secondary | ICD-10-CM

## 2014-03-16 NOTE — Assessment & Plan Note (Signed)
Continue with current prescription therapy as reflected on the Med list.  

## 2014-03-16 NOTE — Assessment & Plan Note (Signed)
5/15 on Zola diet Wt Readings from Last 3 Encounters:  03/16/14 230 lb (104.327 kg)  12/16/13 272 lb (123.378 kg)  10/15/13 298 lb (135.172 kg)

## 2014-03-16 NOTE — Progress Notes (Signed)
Patient ID: [facility], male   DOB: 01-05-1951, 63 y.o.   MRN: [id number]   Subjective:    HPI    The patient presents for a follow-up of  chronic LBP, chronic hypogonadism, HTN previously controlled with medicines. He stopped Maxzide due to cramps - resolved. He was working at [employer] - now he quit and he is retired again.   He smoked pot once prior to previous drug screen - then decided to "detox" himself and did not take oxycodone (he threw it away because he was angry with himself). He hasn't used pot any more. C/o much more pain w/o pain meds.    Wt Readings from Last 3 Encounters:  03/16/14 230 lb (104.327 kg)  12/16/13 272 lb (123.378 kg)  10/15/13 298 lb (135.172 kg)   BP Readings from Last 3 Encounters:  03/16/14 140/72  12/16/13 130/90  10/15/13 142/90       Review of Systems  Constitutional: Negative for appetite change, fatigue and unexpected weight change.  HENT: Negative for congestion, nosebleeds, sneezing, sore throat and trouble swallowing.   Eyes: Negative for itching and visual disturbance.  Respiratory: Negative for cough.   Cardiovascular: Negative for leg swelling.  Gastrointestinal: Negative for diarrhea, blood in stool and abdominal distention.  Genitourinary: Negative for frequency and hematuria.  Musculoskeletal: Negative for gait problem, joint swelling and neck pain.  Skin: Negative for rash.  Neurological: Negative for tremors and speech difficulty.  Psychiatric/Behavioral: Positive for decreased concentration. Negative for suicidal ideas, behavioral problems, confusion, sleep disturbance, self-injury, dysphoric mood and agitation. The patient is nervous/anxious.        Objective:   Physical Exam  Constitutional: He is oriented to person, place, and time. He appears well-developed.  Very obese  HENT:  Mouth/Throat: Oropharynx is clear and moist.  Eyes: Conjunctivae are normal. Pupils are equal, round, and reactive to  light.  Neck: Normal range of motion. No JVD present. No thyromegaly present.  Cardiovascular: Normal rate, regular rhythm, normal heart sounds and intact distal pulses.  Exam reveals no gallop and no friction rub.   No murmur heard. Pulmonary/Chest: Effort normal and breath sounds normal. No respiratory distress. He has no wheezes. He has no rales. He exhibits no tenderness.  Abdominal: Soft. Bowel sounds are normal. He exhibits no distension and no mass. There is no tenderness. There is no rebound and no guarding.  Musculoskeletal: Normal range of motion. He exhibits tenderness (LS is tender). He exhibits no edema.  R knee is not tender w/ROM  Lymphadenopathy:    He has no cervical adenopathy.  Neurological: He is alert and oriented to person, place, and time. He has normal reflexes. No cranial nerve deficit. He exhibits normal muscle tone. Coordination normal.  Skin: Skin is warm and dry. No rash noted. No erythema.  Psychiatric: His behavior is normal. Judgment and thought content normal.     Subjective:    HPI    The patient presents for a follow-up of  chronic LBP, chronic hypogonadism, HTN previously controlled with medicines. He stopped Maxzide due to cramps - resolved. He was working at [employer] - now he quit and he is retired again.   He smoked pot once prior to previous drug screen - then decided to "detox" himself and did not take oxycodone (he threw it away because he was angry with himself). He hasn't used pot any more. C/o much more pain w/o pain meds.    Wt Readings from Last  3 Encounters:  03/16/14 230 lb (104.327 kg)  12/16/13 272 lb (123.378 kg)  10/15/13 298 lb (135.172 kg)   BP Readings from Last 3 Encounters:  03/16/14 140/72  12/16/13 130/90  10/15/13 142/90       Review of Systems  Constitutional: Negative for appetite change, fatigue and unexpected weight change.  HENT: Negative for congestion, nosebleeds, sneezing, sore throat and  trouble swallowing.   Eyes: Negative for itching and visual disturbance.  Respiratory: Negative for cough.   Cardiovascular: Negative for leg swelling.  Gastrointestinal: Negative for diarrhea, blood in stool and abdominal distention.  Genitourinary: Negative for frequency and hematuria.  Musculoskeletal: Negative for gait problem, joint swelling and neck pain.  Skin: Negative for rash.  Neurological: Negative for tremors and speech difficulty.  Psychiatric/Behavioral: Positive for decreased concentration. Negative for suicidal ideas, behavioral problems, confusion, sleep disturbance, self-injury, dysphoric mood and agitation. The patient is nervous/anxious.        Objective:   Physical Exam  Constitutional: He is oriented to person, place, and time. He appears well-developed.  Very obese  HENT:  Mouth/Throat: Oropharynx is clear and moist.  Eyes: Conjunctivae are normal. Pupils are equal, round, and reactive to light.  Neck: Normal range of motion. No JVD present. No thyromegaly present.  Cardiovascular: Normal rate, regular rhythm, normal heart sounds and intact distal pulses.  Exam reveals no gallop and no friction rub.   No murmur heard. Pulmonary/Chest: Effort normal and breath sounds normal. No respiratory distress. He has no wheezes. He has no rales. He exhibits no tenderness.  Abdominal: Soft. Bowel sounds are normal. He exhibits no distension and no mass. There is no tenderness. There is no rebound and no guarding.  Musculoskeletal: Normal range of motion. He exhibits tenderness (LS is tender). He exhibits no edema.  R knee is not tender w/ROM  Lymphadenopathy:    He has no cervical adenopathy.  Neurological: He is alert and oriented to person, place, and time. He has normal reflexes. No cranial nerve deficit. He exhibits normal muscle tone. Coordination normal.  Skin: Skin is warm and dry. No rash noted. No erythema.  Psychiatric: His behavior is normal. Judgment and thought  content normal.     Lab Results  Component Value Date   WBC 5.2 04/02/2012   HGB 13.2 04/02/2012   HCT 39.3 04/02/2012   PLT 223.0 04/02/2012   GLUCOSE 95 06/24/2013   CHOL 154 07/06/2013   TRIG 175.0* 07/06/2013   HDL 44.50 07/06/2013   LDLDIRECT 70.7 01/19/2009   LDLCALC 75 07/06/2013   ALT 26 06/24/2013   AST 26 06/24/2013   NA 137 06/24/2013   K 4.1 06/24/2013   CL 99 06/24/2013   CREATININE 1.0 06/24/2013   BUN 16 06/24/2013   CO2 27 06/24/2013   TSH 2.47 06/24/2013   PSA 0.78 04/02/2012   HGBA1C 6.2 06/24/2013         Assessment & Plan:     Lab Results  Component Value Date   WBC 5.2 04/02/2012   HGB 13.2 04/02/2012   HCT 39.3 04/02/2012   PLT 223.0 04/02/2012   GLUCOSE 95 06/24/2013   CHOL 154 07/06/2013   TRIG 175.0* 07/06/2013   HDL 44.50 07/06/2013   LDLDIRECT 70.7 01/19/2009   LDLCALC 75 07/06/2013   ALT 26 06/24/2013   AST 26 06/24/2013   NA 137 06/24/2013   K 4.1 06/24/2013   CL 99 06/24/2013   CREATININE 1.0 06/24/2013   BUN 16 06/24/2013  CO2 27 06/24/2013   TSH 2.47 06/24/2013   PSA 0.78 04/02/2012   HGBA1C 6.2 06/24/2013         Assessment & Plan:

## 2014-03-16 NOTE — Progress Notes (Signed)
Pre visit review using our clinic review tool, if applicable. No additional management support is needed unless otherwise documented below in the visit note. 

## 2014-03-17 ENCOUNTER — Telehealth: Payer: Self-pay | Admitting: Internal Medicine

## 2014-03-17 NOTE — Telephone Encounter (Signed)
Relevant patient education assigned to patient using Emmi. ° °

## 2014-03-23 ENCOUNTER — Telehealth: Payer: Self-pay | Admitting: *Deleted

## 2014-03-23 NOTE — Telephone Encounter (Signed)
Pt called requesting [facility] results and Oxycodone refill.  Please advise

## 2014-03-23 NOTE — Telephone Encounter (Signed)
pls get results thx

## 2014-03-24 NOTE — Telephone Encounter (Signed)
Spoke with pt advised of MDs message. He verbalized understanding.

## 2014-03-30 ENCOUNTER — Telehealth: Payer: Self-pay | Admitting: *Deleted

## 2014-03-30 ENCOUNTER — Other Ambulatory Visit: Payer: Self-pay | Admitting: Internal Medicine

## 2014-03-30 DIAGNOSIS — M545 Low back pain, unspecified: Secondary | ICD-10-CM

## 2014-03-30 NOTE — Telephone Encounter (Signed)
Pt called requesting Pain [facility] referral.  Please advise

## 2014-03-30 NOTE — Telephone Encounter (Signed)
Will do Thx 

## 2014-03-30 NOTE — Telephone Encounter (Signed)
Spoke with pt advised referral done

## 2014-04-02 ENCOUNTER — Other Ambulatory Visit (INDEPENDENT_AMBULATORY_CARE_PROVIDER_SITE_OTHER): Payer: 59

## 2014-04-02 DIAGNOSIS — E291 Testicular hypofunction: Secondary | ICD-10-CM

## 2014-04-02 DIAGNOSIS — F329 Major depressive disorder, single episode, unspecified: Secondary | ICD-10-CM

## 2014-04-02 DIAGNOSIS — I10 Essential (primary) hypertension: Secondary | ICD-10-CM

## 2014-04-02 DIAGNOSIS — F3289 Other specified depressive episodes: Secondary | ICD-10-CM

## 2014-04-02 DIAGNOSIS — M545 Low back pain, unspecified: Secondary | ICD-10-CM

## 2014-04-02 DIAGNOSIS — K219 Gastro-esophageal reflux disease without esophagitis: Secondary | ICD-10-CM

## 2014-04-02 DIAGNOSIS — E119 Type 2 diabetes mellitus without complications: Secondary | ICD-10-CM

## 2014-04-02 LAB — CBC WITH DIFFERENTIAL/PLATELET
BASOS PCT: 0.7 % (ref 0.0–3.0)
Basophils Absolute: 0 10*3/uL (ref 0.0–0.1)
EOS PCT: 3.5 % (ref 0.0–5.0)
Eosinophils Absolute: 0.2 10*3/uL (ref 0.0–0.7)
HCT: 38.9 % — ABNORMAL LOW (ref 39.0–52.0)
Hemoglobin: 13.3 g/dL (ref 13.0–17.0)
LYMPHS PCT: 30.8 % (ref 12.0–46.0)
Lymphs Abs: 1.5 10*3/uL (ref 0.7–4.0)
MCHC: 34.1 g/dL (ref 30.0–36.0)
MCV: 96.3 fl (ref 78.0–100.0)
MONOS PCT: 10.4 % (ref 3.0–12.0)
Monocytes Absolute: 0.5 10*3/uL (ref 0.1–1.0)
Neutro Abs: 2.6 10*3/uL (ref 1.4–7.7)
Neutrophils Relative %: 54.6 % (ref 43.0–77.0)
PLATELETS: 225 10*3/uL (ref 150.0–400.0)
RBC: 4.04 Mil/uL — AB (ref 4.22–5.81)
RDW: 12.6 % (ref 11.5–15.5)
WBC: 4.8 10*3/uL (ref 4.0–10.5)

## 2014-04-02 LAB — BASIC METABOLIC PANEL
BUN: 11 mg/dL (ref 6–23)
CO2: 31 mEq/L (ref 19–32)
CREATININE: 0.9 mg/dL (ref 0.4–1.5)
Calcium: 9.7 mg/dL (ref 8.4–10.5)
Chloride: 93 mEq/L — ABNORMAL LOW (ref 96–112)
GFR: 95.39 mL/min (ref >60.00)
Glucose, Bld: 103 mg/dL — ABNORMAL HIGH (ref 70–99)
Potassium: 4.1 mEq/L (ref 3.5–5.1)
Sodium: 134 mEq/L — ABNORMAL LOW (ref 135–145)

## 2014-04-02 LAB — URINALYSIS
Bilirubin Urine: NEGATIVE
Hgb urine dipstick: NEGATIVE
Ketones, ur: NEGATIVE
Leukocytes, UA: NEGATIVE
Nitrite: NEGATIVE
Specific Gravity, Urine: <=1.005 — AB (ref 1.000–1.030)
Total Protein, Urine: NEGATIVE
UROBILINOGEN UA: 0.2 (ref 0.0–1.0)
Urine Glucose: NEGATIVE
pH: 6.5 (ref 5.0–8.0)

## 2014-04-02 LAB — LIPID PANEL
CHOLESTEROL: 153 mg/dL (ref 0–200)
HDL: 58.6 mg/dL (ref >39.00)
LDL Cholesterol: 85 mg/dL (ref 0–99)
TRIGLYCERIDES: 48 mg/dL (ref 0.0–149.0)
Total CHOL/HDL Ratio: 3
VLDL: 9.6 mg/dL (ref 0.0–40.0)

## 2014-04-02 LAB — TSH: TSH: 1.32 u[IU]/mL (ref 0.35–4.50)

## 2014-04-02 LAB — PSA: PSA: 0.53 ng/mL (ref 0.10–4.00)

## 2014-04-02 LAB — HEPATIC FUNCTION PANEL
ALBUMIN: 3.8 g/dL (ref 3.5–5.2)
ALT: 47 U/L (ref 0–53)
AST: 37 U/L (ref 0–37)
Alkaline Phosphatase: 35 U/L — ABNORMAL LOW (ref 39–117)
Bilirubin, Direct: 0.2 mg/dL (ref 0.0–0.3)
TOTAL PROTEIN: 6.8 g/dL (ref 6.0–8.3)
Total Bilirubin: 1 mg/dL (ref 0.2–1.2)

## 2014-04-02 LAB — HEMOGLOBIN A1C: HEMOGLOBIN A1C: 5.6 % (ref 4.6–6.5)

## 2014-04-12 ENCOUNTER — Encounter (INDEPENDENT_AMBULATORY_CARE_PROVIDER_SITE_OTHER): Payer: 59 | Admitting: Ophthalmology

## 2014-04-12 ENCOUNTER — Encounter (HOSPITAL_COMMUNITY): Payer: Self-pay | Admitting: *Deleted

## 2014-04-12 DIAGNOSIS — H33309 Unspecified retinal break, unspecified eye: Secondary | ICD-10-CM

## 2014-04-12 DIAGNOSIS — H43819 Vitreous degeneration, unspecified eye: Secondary | ICD-10-CM

## 2014-04-12 DIAGNOSIS — H4310 Vitreous hemorrhage, unspecified eye: Secondary | ICD-10-CM

## 2014-04-13 ENCOUNTER — Encounter (HOSPITAL_COMMUNITY)
Admission: RE | Disposition: A | Discharge status: Home / Self Care | Payer: Self-pay | Source: Ambulatory Visit | Attending: Ophthalmology

## 2014-04-13 ENCOUNTER — Ambulatory Visit (HOSPITAL_COMMUNITY): Payer: 59

## 2014-04-13 ENCOUNTER — Ambulatory Visit (HOSPITAL_COMMUNITY)
Admission: RE | Admit: 2014-04-13 | Discharge: 2014-04-14 | Disposition: A | Discharge status: Home / Self Care | Payer: 59 | Source: Ambulatory Visit | Attending: Ophthalmology | Admitting: Ophthalmology

## 2014-04-13 ENCOUNTER — Encounter (HOSPITAL_COMMUNITY): Payer: Self-pay | Admitting: *Deleted

## 2014-04-13 ENCOUNTER — Encounter (HOSPITAL_COMMUNITY): Payer: 59 | Admitting: Certified Registered Nurse Anesthetist

## 2014-04-13 ENCOUNTER — Ambulatory Visit (HOSPITAL_COMMUNITY): Payer: 59 | Admitting: Certified Registered Nurse Anesthetist

## 2014-04-13 ENCOUNTER — Encounter: Payer: 59 | Admitting: Internal Medicine

## 2014-04-13 DIAGNOSIS — H4312 Vitreous hemorrhage, left eye: Secondary | ICD-10-CM | POA: Diagnosis present

## 2014-04-13 DIAGNOSIS — K219 Gastro-esophageal reflux disease without esophagitis: Secondary | ICD-10-CM | POA: Insufficient documentation

## 2014-04-13 DIAGNOSIS — E669 Obesity, unspecified: Secondary | ICD-10-CM | POA: Insufficient documentation

## 2014-04-13 DIAGNOSIS — H33009 Unspecified retinal detachment with retinal break, unspecified eye: Secondary | ICD-10-CM

## 2014-04-13 DIAGNOSIS — H3340 Traction detachment of retina, unspecified eye: Secondary | ICD-10-CM | POA: Insufficient documentation

## 2014-04-13 DIAGNOSIS — Z961 Presence of intraocular lens: Secondary | ICD-10-CM | POA: Insufficient documentation

## 2014-04-13 DIAGNOSIS — M545 Low back pain, unspecified: Secondary | ICD-10-CM | POA: Insufficient documentation

## 2014-04-13 DIAGNOSIS — R7309 Other abnormal glucose: Secondary | ICD-10-CM | POA: Insufficient documentation

## 2014-04-13 DIAGNOSIS — E291 Testicular hypofunction: Secondary | ICD-10-CM | POA: Insufficient documentation

## 2014-04-13 DIAGNOSIS — F411 Generalized anxiety disorder: Secondary | ICD-10-CM | POA: Insufficient documentation

## 2014-04-13 DIAGNOSIS — G4733 Obstructive sleep apnea (adult) (pediatric): Secondary | ICD-10-CM | POA: Insufficient documentation

## 2014-04-13 DIAGNOSIS — H4310 Vitreous hemorrhage, unspecified eye: Secondary | ICD-10-CM | POA: Insufficient documentation

## 2014-04-13 DIAGNOSIS — J45909 Unspecified asthma, uncomplicated: Secondary | ICD-10-CM | POA: Insufficient documentation

## 2014-04-13 DIAGNOSIS — F3289 Other specified depressive episodes: Secondary | ICD-10-CM | POA: Insufficient documentation

## 2014-04-13 DIAGNOSIS — F410 Panic disorder [episodic paroxysmal anxiety] without agoraphobia: Secondary | ICD-10-CM | POA: Insufficient documentation

## 2014-04-13 DIAGNOSIS — F329 Major depressive disorder, single episode, unspecified: Secondary | ICD-10-CM | POA: Insufficient documentation

## 2014-04-13 DIAGNOSIS — I10 Essential (primary) hypertension: Secondary | ICD-10-CM | POA: Insufficient documentation

## 2014-04-13 DIAGNOSIS — H33002 Unspecified retinal detachment with retinal break, left eye: Secondary | ICD-10-CM | POA: Diagnosis present

## 2014-04-13 HISTORY — PX: LASER PHOTO ABLATION: SHX5942

## 2014-04-13 HISTORY — DX: Cardiac murmur, unspecified: R01.1

## 2014-04-13 HISTORY — PX: REPAIR OF COMPLEX TRACTION RETINAL DETACHMENT: SHX6217

## 2014-04-13 HISTORY — DX: Other chronic pain: G89.29

## 2014-04-13 HISTORY — PX: PARS PLANA VITRECTOMY: SHX2166

## 2014-04-13 HISTORY — DX: Pneumonia, unspecified organism: J18.9

## 2014-04-13 HISTORY — DX: Low back pain: M54.5

## 2014-04-13 HISTORY — DX: Low back pain, unspecified: M54.50

## 2014-04-13 LAB — GLUCOSE, CAPILLARY
GLUCOSE-CAPILLARY: 91 mg/dL (ref 70–99)
GLUCOSE-CAPILLARY: 76 mg/dL (ref 70–99)
Glucose-Capillary: 105 mg/dL — ABNORMAL HIGH (ref 70–99)
Glucose-Capillary: 88 mg/dL (ref 70–99)

## 2014-04-13 SURGERY — PARS PLANA VITRECTOMY WITH 25 GAUGE
Anesthesia: General | Site: Eye | Laterality: Left

## 2014-04-13 MED ORDER — ROCURONIUM BROMIDE 50 MG/5ML IV SOLN
INTRAVENOUS | Status: AC
  Filled 2014-04-13: qty 1

## 2014-04-13 MED ORDER — 0.9 % SODIUM CHLORIDE (POUR BTL) OPTIME
TOPICAL | Status: DC | PRN
  Administered 2014-04-13: 1000 mL

## 2014-04-13 MED ORDER — ONDANSETRON HCL 4 MG/2ML IJ SOLN
INTRAMUSCULAR | Status: DC | PRN
  Administered 2014-04-13: 4 mg via INTRAVENOUS

## 2014-04-13 MED ORDER — BSS PLUS IO SOLN
INTRAOCULAR | Status: AC
  Filled 2014-04-13: qty 500

## 2014-04-13 MED ORDER — TEMAZEPAM 15 MG PO CAPS: 15.0000 mg | ORAL_CAPSULE | Freq: Every evening | ORAL | Status: DC | PRN

## 2014-04-13 MED ORDER — HYDROCODONE-ACETAMINOPHEN 5-325 MG PO TABS
1.0000 | ORAL_TABLET | ORAL | Status: DC | PRN
  Administered 2014-04-13: 2 via ORAL
  Filled 2014-04-13 (×2): qty 2

## 2014-04-13 MED ORDER — DOCUSATE SODIUM 100 MG PO CAPS
100.0000 mg | ORAL_CAPSULE | Freq: Two times a day (BID) | ORAL | Status: DC
  Administered 2014-04-13: 100 mg via ORAL
  Filled 2014-04-13 (×2): qty 1

## 2014-04-13 MED ORDER — PREDNISOLONE ACETATE 1 % OP SUSP
1.0000 [drp] | Freq: Four times a day (QID) | OPHTHALMIC | Status: DC
  Filled 2014-04-13: qty 1
  Filled 2014-04-13: qty 5

## 2014-04-13 MED ORDER — BACITRACIN-POLYMYXIN B 500-10000 UNIT/GM OP OINT
TOPICAL_OINTMENT | OPHTHALMIC | Status: AC
  Filled 2014-04-13: qty 3.5

## 2014-04-13 MED ORDER — SODIUM HYALURONATE 10 MG/ML IO SOLN
INTRAOCULAR | Status: DC | PRN
  Administered 2014-04-13: 0.85 mL via INTRAOCULAR

## 2014-04-13 MED ORDER — GENTAMICIN SULFATE 40 MG/ML IJ SOLN
INTRAMUSCULAR | Status: AC
  Filled 2014-04-13: qty 2

## 2014-04-13 MED ORDER — EPINEPHRINE HCL 1 MG/ML IJ SOLN
INTRAMUSCULAR | Status: AC
  Filled 2014-04-13: qty 1

## 2014-04-13 MED ORDER — BUPIVACAINE HCL (PF) 0.75 % IJ SOLN
INTRAMUSCULAR | Status: DC | PRN
  Administered 2014-04-13: 10 mL

## 2014-04-13 MED ORDER — ATROPINE SULFATE 1 % OP SOLN
OPHTHALMIC | Status: AC
  Filled 2014-04-13: qty 2

## 2014-04-13 MED ORDER — LIDOCAINE HCL (CARDIAC) 20 MG/ML IV SOLN
INTRAVENOUS | Status: DC | PRN
  Administered 2014-04-13: 100 mg via INTRAVENOUS

## 2014-04-13 MED ORDER — SODIUM CHLORIDE 0.9 % IJ SOLN
INTRAMUSCULAR | Status: AC
  Filled 2014-04-13: qty 10

## 2014-04-13 MED ORDER — GATIFLOXACIN 0.5 % OP SOLN
OPHTHALMIC | Status: AC
  Administered 2014-04-13: 1 [drp] via OPHTHALMIC
  Filled 2014-04-13: qty 2.5

## 2014-04-13 MED ORDER — MIDAZOLAM HCL 5 MG/5ML IJ SOLN
INTRAMUSCULAR | Status: DC | PRN
  Administered 2014-04-13: 2 mg via INTRAVENOUS

## 2014-04-13 MED ORDER — ACETAMINOPHEN 325 MG PO TABS: 325.0000 mg | ORAL_TABLET | ORAL | Status: DC | PRN

## 2014-04-13 MED ORDER — POLYMYXIN B SULFATE 500000 UNITS IJ SOLR
INTRAMUSCULAR | Status: AC
  Filled 2014-04-13: qty 1

## 2014-04-13 MED ORDER — BSS IO SOLN
INTRAOCULAR | Status: AC
  Filled 2014-04-13: qty 15

## 2014-04-13 MED ORDER — SODIUM HYALURONATE 10 MG/ML IO SOLN
INTRAOCULAR | Status: AC
  Filled 2014-04-13: qty 0.85

## 2014-04-13 MED ORDER — HYPROMELLOSE (GONIOSCOPIC) 2.5 % OP SOLN
OPHTHALMIC | Status: AC
  Filled 2014-04-13: qty 15

## 2014-04-13 MED ORDER — DEXAMETHASONE SODIUM PHOSPHATE 10 MG/ML IJ SOLN
INTRAMUSCULAR | Status: DC | PRN
  Administered 2014-04-13: 10 mg

## 2014-04-13 MED ORDER — LIDOCAINE HCL (CARDIAC) 20 MG/ML IV SOLN
INTRAVENOUS | Status: AC
  Filled 2014-04-13: qty 10

## 2014-04-13 MED ORDER — ROCURONIUM BROMIDE 100 MG/10ML IV SOLN
INTRAVENOUS | Status: DC | PRN
  Administered 2014-04-13: 50 mg via INTRAVENOUS

## 2014-04-13 MED ORDER — LIDOCAINE HCL 4 % MT SOLN
OROMUCOSAL | Status: DC | PRN
  Administered 2014-04-13: 4 mL via TOPICAL

## 2014-04-13 MED ORDER — ACETAZOLAMIDE SODIUM 500 MG IJ SOLR
INTRAMUSCULAR | Status: AC
  Filled 2014-04-13: qty 500

## 2014-04-13 MED ORDER — PROPOFOL 10 MG/ML IV BOLUS
INTRAVENOUS | Status: AC
  Filled 2014-04-13: qty 20

## 2014-04-13 MED ORDER — BACITRACIN-POLYMYXIN B 500-10000 UNIT/GM OP OINT
TOPICAL_OINTMENT | OPHTHALMIC | Status: DC | PRN
  Administered 2014-04-13: 1 via OPHTHALMIC

## 2014-04-13 MED ORDER — INDAPAMIDE 2.5 MG PO TABS
2.5000 mg | ORAL_TABLET | Freq: Every day | ORAL | Status: DC
  Administered 2014-04-13: 2.5 mg via ORAL
  Filled 2014-04-13 (×2): qty 1

## 2014-04-13 MED ORDER — NEOSTIGMINE METHYLSULFATE 10 MG/10ML IV SOLN
INTRAVENOUS | Status: AC
  Filled 2014-04-13: qty 1

## 2014-04-13 MED ORDER — OXYCODONE HCL 5 MG PO TABS
15.0000 mg | ORAL_TABLET | Freq: Four times a day (QID) | ORAL | Status: DC
  Administered 2014-04-13 (×2): 15 mg via ORAL
  Filled 2014-04-13 (×4): qty 3

## 2014-04-13 MED ORDER — PROPOFOL 10 MG/ML IV BOLUS
INTRAVENOUS | Status: DC | PRN
Start: 2014-04-13 — End: 2014-04-13
  Administered 2014-04-13: 120 mg via INTRAVENOUS

## 2014-04-13 MED ORDER — CYCLOPENTOLATE HCL 1 % OP SOLN
OPHTHALMIC | Status: AC
  Administered 2014-04-13: 1 [drp] via OPHTHALMIC
  Filled 2014-04-13: qty 2

## 2014-04-13 MED ORDER — LIDOCAINE HCL 2 % IJ SOLN
INTRAMUSCULAR | Status: AC
  Filled 2014-04-13: qty 20

## 2014-04-13 MED ORDER — SODIUM CHLORIDE 0.45 % IV SOLN
INTRAVENOUS | Status: DC
  Administered 2014-04-13: 18:00:00 via INTRAVENOUS

## 2014-04-13 MED ORDER — EPHEDRINE SULFATE 50 MG/ML IJ SOLN
INTRAMUSCULAR | Status: DC | PRN
  Administered 2014-04-13 (×4): 5 mg via INTRAVENOUS

## 2014-04-13 MED ORDER — SODIUM CHLORIDE 0.9 % IJ SOLN
INTRAMUSCULAR | Status: DC | PRN
  Administered 2014-04-13: 14:00:00

## 2014-04-13 MED ORDER — GATIFLOXACIN 0.5 % OP SOLN
1.0000 [drp] | OPHTHALMIC | Status: AC | PRN
  Administered 2014-04-13 (×3): 1 [drp] via OPHTHALMIC

## 2014-04-13 MED ORDER — BRIMONIDINE TARTRATE 0.2 % OP SOLN
1.0000 [drp] | Freq: Two times a day (BID) | OPHTHALMIC | Status: DC
  Filled 2014-04-13: qty 5

## 2014-04-13 MED ORDER — TETRACAINE HCL 0.5 % OP SOLN
2.0000 [drp] | Freq: Once | OPHTHALMIC | Status: DC
  Filled 2014-04-13: qty 2

## 2014-04-13 MED ORDER — TRIAMCINOLONE ACETONIDE 40 MG/ML IJ SUSP
INTRAMUSCULAR | Status: AC
  Filled 2014-04-13: qty 5

## 2014-04-13 MED ORDER — CYCLOPENTOLATE HCL 1 % OP SOLN
1.0000 [drp] | OPHTHALMIC | Status: AC | PRN
  Administered 2014-04-13 (×3): 1 [drp] via OPHTHALMIC

## 2014-04-13 MED ORDER — GLYCOPYRROLATE 0.2 MG/ML IJ SOLN
INTRAMUSCULAR | Status: DC | PRN
  Administered 2014-04-13: .6 mg via INTRAVENOUS

## 2014-04-13 MED ORDER — SODIUM CHLORIDE 0.9 % IV SOLN
INTRAVENOUS | Status: DC
  Administered 2014-04-13 (×2): via INTRAVENOUS

## 2014-04-13 MED ORDER — FENTANYL CITRATE 0.05 MG/ML IJ SOLN
INTRAMUSCULAR | Status: AC
  Filled 2014-04-13: qty 5

## 2014-04-13 MED ORDER — EPINEPHRINE HCL 1 MG/ML IJ SOLN
INTRAOCULAR | Status: DC | PRN
  Administered 2014-04-13: 14:00:00

## 2014-04-13 MED ORDER — MAGNESIUM HYDROXIDE 400 MG/5ML PO SUSP: 15.0000 mL | Freq: Four times a day (QID) | ORAL | Status: DC | PRN

## 2014-04-13 MED ORDER — MIDAZOLAM HCL 2 MG/2ML IJ SOLN
INTRAMUSCULAR | Status: AC
  Filled 2014-04-13: qty 2

## 2014-04-13 MED ORDER — GLYCOPYRROLATE 0.2 MG/ML IJ SOLN
INTRAMUSCULAR | Status: AC
Start: 2014-04-13 — End: 2014-04-13
  Filled 2014-04-13: qty 4

## 2014-04-13 MED ORDER — ACETAZOLAMIDE SODIUM 500 MG IJ SOLR
500.0000 mg | Freq: Once | INTRAMUSCULAR | Status: AC
  Administered 2014-04-14: 500 mg via INTRAVENOUS
  Filled 2014-04-13: qty 500

## 2014-04-13 MED ORDER — LACTATED RINGERS IV SOLN: INTRAVENOUS | Status: DC

## 2014-04-13 MED ORDER — GATIFLOXACIN 0.5 % OP SOLN
1.0000 [drp] | Freq: Four times a day (QID) | OPHTHALMIC | Status: DC
  Filled 2014-04-13: qty 2.5

## 2014-04-13 MED ORDER — NEOSTIGMINE METHYLSULFATE 10 MG/10ML IV SOLN
INTRAVENOUS | Status: DC | PRN
  Administered 2014-04-13: 4 mg via INTRAVENOUS

## 2014-04-13 MED ORDER — ATROPINE SULFATE 1 % OP OINT
TOPICAL_OINTMENT | OPHTHALMIC | Status: DC | PRN
  Administered 2014-04-13: 1 via OPHTHALMIC

## 2014-04-13 MED ORDER — CEFAZOLIN SODIUM-DEXTROSE 2-3 GM-% IV SOLR
INTRAVENOUS | Status: AC
  Filled 2014-04-13: qty 50

## 2014-04-13 MED ORDER — FLUTICASONE PROPIONATE 50 MCG/ACT NA SUSP
2.0000 | Freq: Every day | NASAL | Status: DC
Start: 2014-04-14 — End: 2014-04-14
  Filled 2014-04-13: qty 16

## 2014-04-13 MED ORDER — LATANOPROST 0.005 % OP SOLN
1.0000 [drp] | Freq: Every day | OPHTHALMIC | Status: DC
  Filled 2014-04-13: qty 2.5

## 2014-04-13 MED ORDER — FENTANYL CITRATE 0.05 MG/ML IJ SOLN
INTRAMUSCULAR | Status: DC | PRN
  Administered 2014-04-13: 150 ug via INTRAVENOUS

## 2014-04-13 MED ORDER — PHENYLEPHRINE HCL 2.5 % OP SOLN
1.0000 [drp] | OPHTHALMIC | Status: AC | PRN
  Administered 2014-04-13 (×3): 1 [drp] via OPHTHALMIC
  Filled 2014-04-13: qty 15

## 2014-04-13 MED ORDER — BUPIVACAINE HCL (PF) 0.75 % IJ SOLN
INTRAMUSCULAR | Status: AC
  Filled 2014-04-13: qty 10

## 2014-04-13 MED ORDER — TROPICAMIDE 1 % OP SOLN
1.0000 [drp] | OPHTHALMIC | Status: AC | PRN
  Administered 2014-04-13 (×3): 1 [drp] via OPHTHALMIC

## 2014-04-13 MED ORDER — CEFAZOLIN SODIUM-DEXTROSE 2-3 GM-% IV SOLR
2.0000 g | INTRAVENOUS | Status: AC
  Administered 2014-04-13: 2 g via INTRAVENOUS

## 2014-04-13 MED ORDER — TROPICAMIDE 1 % OP SOLN
OPHTHALMIC | Status: AC
  Administered 2014-04-13: 1 [drp] via OPHTHALMIC
  Filled 2014-04-13: qty 3

## 2014-04-13 MED ORDER — ONDANSETRON HCL 4 MG/2ML IJ SOLN: 4.0000 mg | Freq: Four times a day (QID) | INTRAMUSCULAR | Status: DC | PRN

## 2014-04-13 MED ORDER — ONDANSETRON HCL 4 MG/2ML IJ SOLN
INTRAMUSCULAR | Status: AC
  Filled 2014-04-13: qty 2

## 2014-04-13 MED ORDER — MORPHINE SULFATE 2 MG/ML IJ SOLN: 1.0000 mg | INTRAMUSCULAR | Status: DC | PRN

## 2014-04-13 MED ORDER — BACITRACIN-POLYMYXIN B 500-10000 UNIT/GM OP OINT
1.0000 "application " | TOPICAL_OINTMENT | Freq: Four times a day (QID) | OPHTHALMIC | Status: DC
  Filled 2014-04-13: qty 3.5

## 2014-04-13 MED ORDER — DEXAMETHASONE SODIUM PHOSPHATE 10 MG/ML IJ SOLN
INTRAMUSCULAR | Status: AC
  Filled 2014-04-13: qty 1

## 2014-04-13 SURGICAL SUPPLY — 44 items
BALL CTTN LRG ABS STRL LF (GAUZE/BANDAGES/DRESSINGS) ×3
BLADE 10 SAFETY STRL DISP (BLADE) ×3 IMPLANT
CANNULA VLV SOFT TIP 25G (OPHTHALMIC) ×1 IMPLANT
CANNULA VLV SOFT TIP 25GA (OPHTHALMIC) ×3 IMPLANT
COTTONBALL LRG STERILE PKG (GAUZE/BANDAGES/DRESSINGS) ×9 IMPLANT
COVER MAYO STAND STRL (DRAPES) ×3 IMPLANT
DRAPE INCISE 51X51 W/FILM STRL (DRAPES) ×3 IMPLANT
DRAPE OPHTHALMIC 77X100 STRL (CUSTOM PROCEDURE TRAY) ×3 IMPLANT
GLOVE SS BIOGEL STRL SZ 6.5 (GLOVE) ×1 IMPLANT
GLOVE SS BIOGEL STRL SZ 7 (GLOVE) ×1 IMPLANT
GLOVE SUPERSENSE BIOGEL SZ 6.5 (GLOVE) ×2
GLOVE SUPERSENSE BIOGEL SZ 7 (GLOVE) ×2
GLOVE SURG 8.5 LATEX PF (GLOVE) ×3 IMPLANT
GLOVE SURG SS PI 7.0 STRL IVOR (GLOVE) ×2 IMPLANT
GOWN STRL REUS W/ TWL LRG LVL3 (GOWN DISPOSABLE) ×3 IMPLANT
GOWN STRL REUS W/TWL LRG LVL3 (GOWN DISPOSABLE) ×9
HANDLE PNEUMATIC FOR CONSTEL (OPHTHALMIC) IMPLANT
KIT BASIN OR (CUSTOM PROCEDURE TRAY) ×3 IMPLANT
KNIFE CRESCENT 2.5 55 ANG (BLADE) IMPLANT
MICROPICK 25G (MISCELLANEOUS)
NDL 18GX1X1/2 (RX/OR ONLY) (NEEDLE) ×1 IMPLANT
NDL 25GX 5/8IN NON SAFETY (NEEDLE) ×1 IMPLANT
NDL FILTER BLUNT 18X1 1/2 (NEEDLE) ×1 IMPLANT
NDL HYPO 30X.5 LL (NEEDLE) ×1 IMPLANT
NEEDLE 18GX1X1/2 (RX/OR ONLY) (NEEDLE) ×3 IMPLANT
NEEDLE 25GX 5/8IN NON SAFETY (NEEDLE) ×3 IMPLANT
NEEDLE FILTER BLUNT 18X 1/2SAF (NEEDLE) ×2
NEEDLE FILTER BLUNT 18X1 1/2 (NEEDLE) ×1 IMPLANT
NEEDLE HYPO 30X.5 LL (NEEDLE) ×3 IMPLANT
NS IRRIG 1000ML POUR BTL (IV SOLUTION) ×3 IMPLANT
PACK VITRECTOMY CUSTOM (CUSTOM PROCEDURE TRAY) ×3 IMPLANT
PAD ARMBOARD 7.5X6 YLW CONV (MISCELLANEOUS) ×6 IMPLANT
PAK PIK VITRECTOMY CVS 25GA (OPHTHALMIC) ×3 IMPLANT
PIC ILLUMINATED 25G (OPHTHALMIC) ×3
PICK MICROPICK 25G (MISCELLANEOUS) IMPLANT
PIK ILLUMINATED 25G (OPHTHALMIC) ×1 IMPLANT
ROLLS DENTAL (MISCELLANEOUS) ×6 IMPLANT
SPONGE SURGIFOAM ABS GEL 12-7 (HEMOSTASIS) ×3 IMPLANT
SYR 20CC LL (SYRINGE) ×3 IMPLANT
SYR BULB 3OZ (MISCELLANEOUS) ×3 IMPLANT
SYR TB 1ML LUER SLIP (SYRINGE) ×3 IMPLANT
TOWEL OR 17X24 6PK STRL BLUE (TOWEL DISPOSABLE) ×9 IMPLANT
WATER STERILE IRR 1000ML POUR (IV SOLUTION) ×3 IMPLANT
WIPE INSTRUMENT VISIWIPE 73X73 (MISCELLANEOUS) ×3 IMPLANT

## 2014-04-13 NOTE — Anesthesia Procedure Notes (Signed)
Procedure Name: Intubation Date/Time: 04/13/2014 1:34 PM Performed by: Blair Heys E Pre-anesthesia Checklist: Patient identified, Emergency Drugs available, Suction available and Patient being monitored Patient Re-evaluated:Patient Re-evaluated prior to inductionOxygen Delivery Method: Circle system utilized Preoxygenation: Pre-oxygenation with 100% oxygen Intubation Type: IV induction Ventilation: Mask ventilation without difficulty and Oral airway inserted - appropriate to patient size Laryngoscope Size: Sabra Heck and 2 Grade View: Grade II Tube type: Oral Tube size: 7.5 mm Number of attempts: 1 Airway Equipment and Method: Stylet,  Oral airway and LTA kit utilized Placement Confirmation: ETT inserted through vocal cords under direct vision,  positive ETCO2 and breath sounds checked- equal and bilateral Secured at: 23 cm Tube secured with: Tape Dental Injury: Teeth and Oropharynx as per pre-operative assessment

## 2014-04-13 NOTE — Transfer of Care (Signed)
Immediate Anesthesia Transfer of Care Note  Patient: [facility]  Procedure(s) Performed: Procedure(s): PARS PLANA VITRECTOMY WITH 25 GAUGE (Left)  Patient Location: PACU  Anesthesia Type:General  Level of Consciousness: awake, alert  and oriented  Airway & Oxygen Therapy: Patient Spontanous Breathing and Patient connected to nasal cannula oxygen  Post-op Assessment: Report given to PACU RN, Post -op Vital signs reviewed and stable and Patient moving all extremities X 4  Post vital signs: Reviewed and stable  Complications: No apparent anesthesia complications

## 2014-04-13 NOTE — Progress Notes (Signed)
Patient came to recovery room with an eye shield and guaze to the left eye because patient had eye surgery today. Incision not charted, but dressing clean dry and intact.

## 2014-04-13 NOTE — Anesthesia Postprocedure Evaluation (Signed)
Anesthesia Post Note  Patient: [facility]  Procedure(s) Performed: Procedure(s) (LRB): PARS PLANA VITRECTOMY WITH 25 GAUGE (Left) LASER PHOTO ABLATION (Left) REPAIR OF COMPLEX TRACTION RETINAL DETACHMENT (Left)  Anesthesia type: general  Patient location: PACU  Post pain: Pain level controlled  Post assessment: Patient's Cardiovascular Status Stable  Last Vitals:  Filed Vitals:   04/13/14 1536  BP: 147/80  Pulse: 62  Temp: 36.6 C  Resp: 17    Post vital signs: Reviewed and stable  Level of consciousness: sedated  Complications: No apparent anesthesia complications

## 2014-04-13 NOTE — H&P (Signed)
Ronnie Curtis is an 63 y.o. male.   Chief Complaint:sudden vision loss 6 days ago Left eye HPI: History of retinal breaks.  Now has retinal break with sub retinal fluid and vitreous hemorrhage left eye  Past Medical History  Diagnosis Date  . GERD (gastroesophageal reflux disease)   . LBP (low back pain)   . Retinal detachment 2009    Right  . Multiple polyps of ethmoid sinus   . Obesity   . HTN (hypertension)   . Asthmatic bronchitis   . Anxiety     Panic disorder  . Depression   . Allergy     grass, mold, dust  . Arthritis     back  . Hypogonadism male   . Tubular adenoma of colon     2 in 2005 and 3 in 2008  . Hemorrhoids   . OSA (obstructive sleep apnea)     PSG 05/24/10 AHI 90, CPAP 13cm H2O- hasn't used CPAP in years (2015)  . Pneumonia   . Heart murmur     hx of  . Type II or unspecified type diabetes mellitus without mention of complication, not stated as uncontrolled     "Borderline"  pt states he is not diabetic    Past Surgical History  Procedure Laterality Date  . Carpal tunnel release      Left  . Urethrotomy      x3  . Tonsillectomy and adenoidectomy    . Cataract extraction w/ intraocular lens  implant, bilateral  2004  . Torn retinas      left eye x 3, right eye x 2  . Colonoscopy w/ biopsies      multiple  . Nasal septum surgery  05/14/2005    bilateral polypectomy, bilateral total ethmoidectomy, left frontal sinusotomy  . Vasectomy      Family History  Problem Relation Age of Onset  . Hypertension Other   . Allergies Mother   . Lymphoma Mother   . Cancer Mother     lymphoma  . Heart disease Father     CAD, CHF  . Hypertension Brother   . Arthritis Brother     OA  . Colon cancer Neg Hx    Social History:  reports that he has never smoked. He has never used smokeless tobacco. He reports that he drinks alcohol. He reports that he does not use illicit drugs.  Allergies:  Allergies  Allergen Reactions  . Moviprep  [Peg-Kcl-Nacl-Nasulf-Na Asc-C] Nausea And Vomiting  . Cialis [Tadalafil]     HAs  . Erythromycin Other (See Comments)    Sever reflux  . Maxzide [Triamterene-Hctz]     cramps  . Spironolactone     gynecomastia    No prescriptions prior to admission    Review of systems otherwise negative  There were no vitals taken for this visit.  Physical exam: Mental status: oriented x3. Eyes: See eye exam associated with this date of surgery in media tab.  Scanned in by scanning center Ears, Nose, Throat: within normal limits Neck: Within Normal limits General: within normal limits Chest: Within normal limits Breast: deferred Heart: Within normal limits Abdomen: Within normal limits GU: deferred Extremities: within normal limits Skin: within normal limits  Assessment/Plan Vitreous hemorrhage, retinal break with small retinal detachment left eye Plan: To [facility] for Pars plana vitrectomy, gas injection, laser treatment left eye  Hayden Pedro 04/13/2014, 7:22 AM

## 2014-04-13 NOTE — Anesthesia Preprocedure Evaluation (Signed)
Anesthesia Evaluation  Patient identified by MRN, date of birth, ID band Patient awake    Reviewed: Allergy & Precautions, H&P , NPO status , Patient's Chart, lab work & pertinent test results  Airway Mallampati: II TM Distance: >3 FB Neck ROM: Full    Dental  (+) Teeth Intact, Dental Advisory Given   Pulmonary sleep apnea ,          Cardiovascular hypertension,     Neuro/Psych PSYCHIATRIC DISORDERS Anxiety Depression    GI/Hepatic GERD-  Controlled,  Endo/Other  diabetes, Type 2Morbid obesity  Renal/GU      Musculoskeletal  (+) Arthritis -,   Abdominal   Peds  Hematology   Anesthesia Other Findings   Reproductive/Obstetrics                           Anesthesia Physical Anesthesia Plan  ASA: III  Anesthesia Plan: General   Post-op Pain Management:    Induction: Intravenous  Airway Management Planned: Oral ETT  Additional Equipment:   Intra-op Plan:   Post-operative Plan: Extubation in OR  Informed Consent: I have reviewed the patients History and Physical, chart, labs and discussed the procedure including the risks, benefits and alternatives for the proposed anesthesia with the patient or authorized representative who has indicated his/her understanding and acceptance.   Dental advisory given  Plan Discussed with: CRNA, Anesthesiologist and Surgeon  Anesthesia Plan Comments:         Anesthesia Quick Evaluation

## 2014-04-13 NOTE — Brief Op Note (Signed)
Brief Operative note   Preoperative diagnosis:  retinal detachment left eye,vitreous hemorrhage left eye Postoperative diagnosis  Post-Op Diagnosis Codes:    * Retinal detachment with retinal defect, unspecified [361.00]  Procedures: Repair of complex retinal detachment with vitrectomy, laser, peel and gas left eye  Surgeon:  Hayden Pedro, MD...  Assistant:  Deatra Ina SA  Anesthesia: General  Specimen: none  Estimated blood loss:  1cc  Complications: none  Patient sent to PACU in good condition  Composed by Hayden Pedro MD  Dictation number: [phone]

## 2014-04-14 MED ORDER — GATIFLOXACIN 0.5 % OP SOLN: 1.0000 [drp] | Freq: Four times a day (QID) | OPHTHALMIC | Status: DC

## 2014-04-14 MED ORDER — BACITRACIN-POLYMYXIN B 500-10000 UNIT/GM OP OINT: 1.0000 "application " | TOPICAL_OINTMENT | Freq: Four times a day (QID) | OPHTHALMIC | Status: DC

## 2014-04-14 MED ORDER — BRIMONIDINE TARTRATE 0.2 % OP SOLN: 1.0000 [drp] | Freq: Two times a day (BID) | OPHTHALMIC | Status: DC

## 2014-04-14 MED ORDER — HYDROCODONE-ACETAMINOPHEN 5-325 MG PO TABS: 1.0000 | ORAL_TABLET | ORAL | Status: DC | PRN

## 2014-04-14 MED ORDER — PREDNISOLONE ACETATE 1 % OP SUSP: 1.0000 [drp] | Freq: Four times a day (QID) | OPHTHALMIC | Status: DC

## 2014-04-14 MED ORDER — OXYCODONE HCL 5 MG PO TABS
15.0000 mg | ORAL_TABLET | Freq: Four times a day (QID) | ORAL | Status: DC
  Administered 2014-04-14: 15 mg via ORAL
  Filled 2014-04-14: qty 3

## 2014-04-14 NOTE — Progress Notes (Signed)
AVS discharge was given and went over with patient. Patient stated that Dr. Zigmund Daniel gave patient his prescription for hydrocodone to take to his pharmacy. Patient stated that he also had the eye drops bacitracin-polymyxin b, brimonidine, gatifloxacin, and prednisolone acetate with directions on how often to take them. Patient stated that he did not have any questions. Staff assisted patient to his transportation.

## 2014-04-14 NOTE — Op Note (Signed)
NAME:  Ronnie Curtis, RUSSI NO.:  [id number]  MEDICAL RECORD NO.:  [id number]  LOCATION:  6N22C                        FACILITY:  [facility] [place]  PHYSICIAN:  Chrystie Nose. Zigmund Daniel, M.D. DATE OF BIRTH:  1951-07-14  DATE OF PROCEDURE:  04/13/2014 DATE OF DISCHARGE:                              OPERATIVE REPORT   ADMISSION DIAGNOSES:  Rhegmatogenous retinal detachment, vitreous hemorrhage in the left eye.  PROCEDURES:  Pars plana vitrectomy, membrane peel, retinal photocoagulation, gas fluid exchange, all in the left eye.  We proceeded major procedure is repair of complex traction retinal detachment, left eye.  SURGEON:  Chrystie Nose. Zigmund Daniel, M.D.  ASSISTANT:  Deatra Ina, SA.  ANESTHESIA:  General.  DETAILS:  Usual prep and drape, 25-gauge trocars placed at 10, 2, and 4 o'clock, infusion at 4 o'clock.  Provisc placed on the corneal surface and the BIOM viewing system was moved into place.  Pars plana vitrectomy was begun just behind the pseudophakos which was in a malposition but stable.  The iris was totally atrophic with window defects for 360 degrees.  The vitrectomy was carried posteriorly and large clots of red blood were encountered.  These were removed with low suction and rapid cutting.  There was blood on the surface of the retina and the macula. This was vacuumed with vitreous cutter.  The vitrectomy was carried into the mid periphery where surface proliferation was removed with the lighted Endolaser pick or Endopick.  An area of rhegmatogenous detachment was seen superiorly at 12 and at 2 o'clock.  The horseshoe tear and operculums were removed with the vitreous cutter.  All vitreous traction was removed.  At this point, the endolaser was positioned in the eye.  943 burns were placed around the retinal periphery and weak areas and around the area of rhegmatogenous detachment.  The power was 400 mW 1000 microns each and 0.1 seconds each.  A total gas fluid exchange was  carried out at this point.  The indirect ophthalmoscope laser was then moved into place and 814 burns were placed in the area of recently reattached retina at 12 o'clock and 2 o'clock.  The power was 1300 mW 1000 microns each and 0.1 seconds each.  Full reattachment of the retina was occurred at this point.  The instruments were removed from the eye.  The trocars were removed from the eye.  The wounds were tested and found to be secured.  Polymyxin and gentamicin were irrigated into [place] space.  Atropine solution was applied.  Marcaine was injected around the globe for postop pain.  Decadron 10 mg was injected to the lower subconjunctival space.  Polysporin ophthalmic ointment, patch and shield were placed.  Closing pressure was 10 with a Barraquer tonometer.  COMPLICATIONS:  None.  DURATION:  1 hour.     Chrystie Nose. Zigmund Daniel, M.D.     JDM/MEDQ  D:  04/13/2014  T:  04/14/2014  Job:  [id number]

## 2014-04-14 NOTE — Progress Notes (Signed)
04/14/2014, 6:21 AM  Mental Status:  Awake, Alert, Oriented  Anterior segment: Cornea  Clear    Anterior Chamber Clear    Lens:    IOL  Intra Ocular Pressure 22 mmHg with Tonopen  Vitreous: Clear 90%gas bubble   Retina:  Attached Good laser reaction   Impression: Excellent result Retina attached Poor view  Final Diagnosis: Principal Problem:   Rhegmatogenous retinal detachment of left eye Active Problems:   Vitreous hemorrhage, left eye   Plan: start post operative eye drops.  Discharge to home.  Give post operative instructions  Hayden Pedro 04/14/2014, 6:21 AM

## 2014-04-14 NOTE — Discharge Summary (Signed)
Discharge summary not needed on [facility] patients per medical records. 

## 2014-04-15 ENCOUNTER — Encounter: Payer: 59 | Admitting: Internal Medicine

## 2014-04-16 ENCOUNTER — Encounter (HOSPITAL_COMMUNITY): Payer: Self-pay | Admitting: Ophthalmology

## 2014-04-20 ENCOUNTER — Encounter (INDEPENDENT_AMBULATORY_CARE_PROVIDER_SITE_OTHER): Payer: 59 | Admitting: Ophthalmology

## 2014-04-20 DIAGNOSIS — H33309 Unspecified retinal break, unspecified eye: Secondary | ICD-10-CM

## 2014-04-21 ENCOUNTER — Other Ambulatory Visit: Payer: Self-pay | Admitting: Physical Medicine and Rehabilitation

## 2014-04-21 DIAGNOSIS — M545 Low back pain, unspecified: Secondary | ICD-10-CM

## 2014-05-03 ENCOUNTER — Other Ambulatory Visit: Payer: 59

## 2014-05-06 ENCOUNTER — Telehealth: Payer: Self-pay | Admitting: Internal Medicine

## 2014-05-06 NOTE — Telephone Encounter (Signed)
Noted. AP

## 2014-05-06 NOTE — Telephone Encounter (Signed)
Dr. Neomia Dear, pain clinic call to inform Dr. Camila Li that Mr. Ronnie Curtis will not return to his office anymore.

## 2014-05-07 ENCOUNTER — Other Ambulatory Visit: Payer: 59

## 2014-05-07 ENCOUNTER — Encounter: Payer: Self-pay | Admitting: Internal Medicine

## 2014-05-07 ENCOUNTER — Ambulatory Visit (INDEPENDENT_AMBULATORY_CARE_PROVIDER_SITE_OTHER): Payer: 59 | Admitting: Internal Medicine

## 2014-05-07 VITALS — BP 128/90 | HR 80 | Temp 98.3°F | Resp 16 | Ht 66.0 in | Wt 211.0 lb

## 2014-05-07 DIAGNOSIS — M545 Low back pain, unspecified: Secondary | ICD-10-CM

## 2014-05-07 DIAGNOSIS — Z0000 Encounter for general adult medical examination without abnormal findings: Secondary | ICD-10-CM

## 2014-05-07 NOTE — Progress Notes (Signed)
Subjective:    HPI  The patient is here for a wellness exam. The patient has been doing well overall without major physical or psychological issues going on lately.  The patient presents for a follow-up of  chronic LBP, chronic hypogonadism, HTN previously controlled with medicines. He stopped Maxzide due to cramps - resolved. He is working as a Astronomer.  He smoked pot once prior to previous drug screen - then decided to "detox" himself and did not take oxycodone (he threw it away because he was angry with himself). He hasn't used pot any more. C/o much more pain w/o pain meds.  Wt Readings from Last 3 Encounters:  05/07/14 211 lb (95.709 kg)  04/13/14 204 lb 0.6 oz (92.55 kg)  04/13/14 204 lb 0.6 oz (92.55 kg)   BP Readings from Last 3 Encounters:  05/07/14 128/90  04/14/14 129/73  04/14/14 129/73     Review of Systems  Constitutional: Positive for unexpected weight change. Negative for appetite change and fatigue.  HENT: Negative for congestion, nosebleeds, sneezing, sore throat and trouble swallowing.   Eyes: Negative for itching and visual disturbance.  Respiratory: Negative for cough.   Cardiovascular: Negative for chest pain, palpitations and leg swelling.  Gastrointestinal: Negative for nausea, diarrhea, blood in stool and abdominal distention.  Genitourinary: Negative for frequency and hematuria.  Musculoskeletal: Positive for arthralgias, back pain and gait problem. Negative for joint swelling and neck pain.  Skin: Negative for rash.  Neurological: Negative for dizziness, tremors, speech difficulty and weakness.  Psychiatric/Behavioral: Negative for suicidal ideas, sleep disturbance, dysphoric mood and agitation. The patient is not nervous/anxious.        Objective:   Physical Exam  Constitutional: He is oriented to person, place, and time. He appears well-developed. No distress.  NAD Less obese  HENT:  Mouth/Throat: Oropharynx is clear and moist.  Eyes:  Conjunctivae are normal. Pupils are equal, round, and reactive to light.  Neck: Normal range of motion. No JVD present. No thyromegaly present.  Cardiovascular: Normal rate, regular rhythm, normal heart sounds and intact distal pulses.  Exam reveals no gallop and no friction rub.   No murmur heard. Pulmonary/Chest: Effort normal and breath sounds normal. No respiratory distress. He has no wheezes. He has no rales. He exhibits no tenderness.  Abdominal: Soft. Bowel sounds are normal. He exhibits no distension and no mass. There is no tenderness. There is no rebound and no guarding.  Musculoskeletal: Normal range of motion. He exhibits tenderness. He exhibits no edema.  Lymphadenopathy:    He has no cervical adenopathy.  Neurological: He is alert and oriented to person, place, and time. He has normal reflexes. No cranial nerve deficit. He exhibits normal muscle tone. He displays a negative Romberg sign. Coordination and gait normal.  No meningeal signs  Skin: Skin is warm and dry. No rash noted.  Psychiatric: He has a normal mood and affect. His behavior is normal. Judgment and thought content normal.    Lab Results  Component Value Date   WBC 4.8 04/02/2014   HGB 13.3 04/02/2014   HCT 38.9* 04/02/2014   PLT 225.0 04/02/2014   GLUCOSE 103* 04/02/2014   CHOL 153 04/02/2014   TRIG 48.0 04/02/2014   HDL 58.60 04/02/2014   LDLDIRECT 70.7 01/19/2009   LDLCALC 85 04/02/2014   ALT 47 04/02/2014   AST 37 04/02/2014   NA 134* 04/02/2014   K 4.1 04/02/2014   CL 93* 04/02/2014   CREATININE 0.9 04/02/2014   BUN  11 04/02/2014   CO2 31 04/02/2014   TSH 1.32 04/02/2014   PSA 0.53 04/02/2014   HGBA1C 5.6 04/02/2014         Assessment & Plan:

## 2014-05-07 NOTE — Assessment & Plan Note (Addendum)
We discussed age appropriate health related issues, including available/recomended screening tests and vaccinations. We discussed a need for adhering to healthy diet and exercise. Labs/EKG were reviewed. All questions were answered. Colonosc or Cologuar was advised Nicotine test per pt request for [facility]

## 2014-05-07 NOTE — Progress Notes (Deleted)
Pre visit review using our clinic review tool, if applicable. No additional management support is needed unless otherwise documented below in the visit note. 

## 2014-05-11 ENCOUNTER — Encounter (INDEPENDENT_AMBULATORY_CARE_PROVIDER_SITE_OTHER): Payer: 59 | Admitting: Ophthalmology

## 2014-05-11 DIAGNOSIS — H4310 Vitreous hemorrhage, unspecified eye: Secondary | ICD-10-CM

## 2014-05-11 LAB — NICOTINE/COTININE METABOLITES: Cotinine: <10 ng/mL

## 2014-05-18 ENCOUNTER — Telehealth: Payer: Self-pay | Admitting: *Deleted

## 2014-05-18 NOTE — Telephone Encounter (Signed)
Left msg on triage stating have a health form he need md to fill out. Had his cpx last week. Called pt back inform him he can drop form off...Johny Chess

## 2014-05-19 ENCOUNTER — Ambulatory Visit: Payer: 59

## 2014-05-27 ENCOUNTER — Other Ambulatory Visit: Payer: Self-pay | Admitting: *Deleted

## 2014-05-27 MED ORDER — INDAPAMIDE 2.5 MG PO TABS: 2.5000 mg | ORAL_TABLET | Freq: Every day | ORAL | Status: DC

## 2014-06-14 ENCOUNTER — Encounter: Payer: 59 | Admitting: Internal Medicine

## 2014-07-06 ENCOUNTER — Encounter: Payer: Self-pay | Admitting: Internal Medicine

## 2014-07-06 ENCOUNTER — Ambulatory Visit (INDEPENDENT_AMBULATORY_CARE_PROVIDER_SITE_OTHER): Payer: 59 | Admitting: Internal Medicine

## 2014-07-06 DIAGNOSIS — Z23 Encounter for immunization: Secondary | ICD-10-CM

## 2014-07-06 DIAGNOSIS — I10 Essential (primary) hypertension: Secondary | ICD-10-CM

## 2014-07-06 NOTE — Progress Notes (Signed)
Subjective:    HPI  Pt lost more wt on Zola's diet (124 lbs total)!  The patient presents for a follow-up of  chronic LBP, chronic hypogonadism, HTN previously controlled with medicines. He stopped Maxzide due to cramps - resolved. He is working as a Astronomer.  He smoked pot once prior to previous drug screen - then decided to "detox" himself and did not take oxycodone (he threw it away because he was angry with himself). He hasn't used pot any more.   C/o more back pain w/o pain meds.  Wt Readings from Last 3 Encounters:  07/06/14 179 lb (81.194 kg)  05/07/14 211 lb (95.709 kg)  04/13/14 204 lb 0.6 oz (92.55 kg)   BP Readings from Last 3 Encounters:  07/06/14 138/76  05/07/14 128/90  04/14/14 129/73     Review of Systems  Constitutional: Positive for unexpected weight change. Negative for appetite change and fatigue.  HENT: Negative for congestion, nosebleeds, sneezing, sore throat and trouble swallowing.   Eyes: Negative for itching and visual disturbance.  Respiratory: Negative for cough.   Cardiovascular: Negative for chest pain, palpitations and leg swelling.  Gastrointestinal: Negative for nausea, diarrhea, blood in stool and abdominal distention.  Genitourinary: Negative for frequency and hematuria.  Musculoskeletal: Positive for arthralgias, back pain and gait problem. Negative for joint swelling and neck pain.  Skin: Negative for rash.  Neurological: Negative for dizziness, tremors, speech difficulty and weakness.  Psychiatric/Behavioral: Negative for suicidal ideas, sleep disturbance, dysphoric mood and agitation. The patient is not nervous/anxious.        Objective:   Physical Exam  Constitutional: He is oriented to person, place, and time. He appears well-developed. No distress.  NAD Less obese  HENT:  Mouth/Throat: Oropharynx is clear and moist.  Eyes: Conjunctivae are normal. Pupils are equal, round, and reactive to light.  Neck: Normal range of  motion. No JVD present. No thyromegaly present.  Cardiovascular: Normal rate, regular rhythm, normal heart sounds and intact distal pulses.  Exam reveals no gallop and no friction rub.   No murmur heard. Pulmonary/Chest: Effort normal and breath sounds normal. No respiratory distress. He has no wheezes. He has no rales. He exhibits no tenderness.  Abdominal: Soft. Bowel sounds are normal. He exhibits no distension and no mass. There is no tenderness. There is no rebound and no guarding.  Musculoskeletal: Normal range of motion. He exhibits tenderness. He exhibits no edema.  Lymphadenopathy:    He has no cervical adenopathy.  Neurological: He is alert and oriented to person, place, and time. He has normal reflexes. No cranial nerve deficit. He exhibits normal muscle tone. He displays a negative Romberg sign. Coordination and gait normal.  No meningeal signs  Skin: Skin is warm and dry. No rash noted.  Psychiatric: He has a normal mood and affect. His behavior is normal. Judgment and thought content normal.  LS is tender w/ROM  Lab Results  Component Value Date   WBC 4.8 04/02/2014   HGB 13.3 04/02/2014   HCT 38.9* 04/02/2014   PLT 225.0 04/02/2014   GLUCOSE 103* 04/02/2014   CHOL 153 04/02/2014   TRIG 48.0 04/02/2014   HDL 58.60 04/02/2014   LDLDIRECT 70.7 01/19/2009   LDLCALC 85 04/02/2014   ALT 47 04/02/2014   AST 37 04/02/2014   NA 134* 04/02/2014   K 4.1 04/02/2014   CL 93* 04/02/2014   CREATININE 0.9 04/02/2014   BUN 11 04/02/2014   CO2 31 04/02/2014   TSH 1.32  04/02/2014   PSA 0.53 04/02/2014   HGBA1C 5.6 04/02/2014         Assessment & Plan:

## 2014-07-06 NOTE — Progress Notes (Signed)
Pre visit review using our clinic review tool, if applicable. No additional management support is needed unless otherwise documented below in the visit note. 

## 2014-07-06 NOTE — Assessment & Plan Note (Signed)
on Ronnie Curtis's diet - lost 124 lbs total !!!!

## 2014-07-06 NOTE — Assessment & Plan Note (Signed)
Better - cont Rx 

## 2014-07-07 ENCOUNTER — Telehealth: Payer: Self-pay | Admitting: Internal Medicine

## 2014-07-07 NOTE — Telephone Encounter (Signed)
Relevant patient education assigned to patient using Emmi. ° °

## 2014-07-20 ENCOUNTER — Encounter (INDEPENDENT_AMBULATORY_CARE_PROVIDER_SITE_OTHER): Payer: 59 | Admitting: Ophthalmology

## 2014-07-20 DIAGNOSIS — H33309 Unspecified retinal break, unspecified eye: Secondary | ICD-10-CM

## 2014-07-20 DIAGNOSIS — H4310 Vitreous hemorrhage, unspecified eye: Secondary | ICD-10-CM

## 2014-07-20 DIAGNOSIS — H43819 Vitreous degeneration, unspecified eye: Secondary | ICD-10-CM

## 2014-09-10 ENCOUNTER — Other Ambulatory Visit (INDEPENDENT_AMBULATORY_CARE_PROVIDER_SITE_OTHER): Payer: 59

## 2014-09-10 ENCOUNTER — Encounter: Payer: Self-pay | Admitting: Internal Medicine

## 2014-09-10 ENCOUNTER — Ambulatory Visit (INDEPENDENT_AMBULATORY_CARE_PROVIDER_SITE_OTHER): Payer: 59 | Admitting: Internal Medicine

## 2014-09-10 VITALS — BP 140/74 | HR 48 | Temp 97.8°F | Resp 16 | Ht 67.0 in | Wt 181.0 lb

## 2014-09-10 DIAGNOSIS — R109 Unspecified abdominal pain: Secondary | ICD-10-CM | POA: Insufficient documentation

## 2014-09-10 DIAGNOSIS — R10A1 Flank pain, right side: Secondary | ICD-10-CM

## 2014-09-10 DIAGNOSIS — J069 Acute upper respiratory infection, unspecified: Secondary | ICD-10-CM

## 2014-09-10 LAB — URINALYSIS
Bilirubin Urine: NEGATIVE
Hgb urine dipstick: NEGATIVE
Leukocytes, UA: NEGATIVE
Nitrite: NEGATIVE
PH: 7 (ref 5.0–8.0)
Specific Gravity, Urine: <=1.005 — AB (ref 1.000–1.030)
TOTAL PROTEIN, URINE-UPE24: NEGATIVE
Urine Glucose: NEGATIVE
Urobilinogen, UA: 0.2 (ref 0.0–1.0)

## 2014-09-10 LAB — CBC WITH DIFFERENTIAL/PLATELET
Basophils Absolute: 0 10*3/uL (ref 0.0–0.1)
Basophils Relative: 0.6 % (ref 0.0–3.0)
EOS PCT: 1.8 % (ref 0.0–5.0)
Eosinophils Absolute: 0.2 10*3/uL (ref 0.0–0.7)
HEMATOCRIT: 42.1 % (ref 39.0–52.0)
HEMOGLOBIN: 14 g/dL (ref 13.0–17.0)
LYMPHS ABS: 1.3 10*3/uL (ref 0.7–4.0)
Lymphocytes Relative: 15 % (ref 12.0–46.0)
MCHC: 33.2 g/dL (ref 30.0–36.0)
MCV: 96.6 fl (ref 78.0–100.0)
MONOS PCT: 9 % (ref 3.0–12.0)
Monocytes Absolute: 0.8 10*3/uL (ref 0.1–1.0)
NEUTROS ABS: 6.3 10*3/uL (ref 1.4–7.7)
Neutrophils Relative %: 73.6 % (ref 43.0–77.0)
Platelets: 221 10*3/uL (ref 150.0–400.0)
RBC: 4.36 Mil/uL (ref 4.22–5.81)
RDW: 12.6 % (ref 11.5–15.5)
WBC: 8.6 10*3/uL (ref 4.0–10.5)

## 2014-09-10 LAB — BASIC METABOLIC PANEL
BUN: 10 mg/dL (ref 6–23)
CALCIUM: 9.7 mg/dL (ref 8.4–10.5)
CO2: 23 meq/L (ref 19–32)
CREATININE: 0.8 mg/dL (ref 0.4–1.5)
Chloride: 94 mEq/L — ABNORMAL LOW (ref 96–112)
GFR: 105.07 mL/min (ref >60.00)
Glucose, Bld: 98 mg/dL (ref 70–99)
Potassium: 3.6 mEq/L (ref 3.5–5.1)
Sodium: 132 mEq/L — ABNORMAL LOW (ref 135–145)

## 2014-09-10 MED ORDER — CEFUROXIME AXETIL 250 MG PO TABS
250.0000 mg | ORAL_TABLET | Freq: Two times a day (BID) | ORAL | Status: DC
Start: 2014-09-10 — End: 2014-11-08

## 2014-09-10 MED ORDER — KETOROLAC TROMETHAMINE 10 MG PO TABS: 10.0000 mg | ORAL_TABLET | Freq: Four times a day (QID) | ORAL | Status: DC | PRN

## 2014-09-10 NOTE — Progress Notes (Signed)
Pre visit review using our clinic review tool, if applicable. No additional management support is needed unless otherwise documented below in the visit note. 

## 2014-09-10 NOTE — Progress Notes (Signed)
Subjective:    HPI  C/o URI sx's  C/o LBP R side sharp off and on x 1 mo - worse last night -- ?kidney stone  Pt lost more wt on Zola's diet (124 lbs total)!  The patient presents for a follow-up of  chronic LBP, chronic hypogonadism, HTN previously controlled with medicines. He stopped Maxzide due to cramps - resolved. He is working as a Astronomer.  He smoked pot once prior to previous drug screen - then decided to "detox" himself and did not take oxycodone (he threw it away because he was angry with himself). He hasn't used pot any more.   C/o more back pain w/o pain meds.  Wt Readings from Last 3 Encounters:  09/10/14 181 lb (82.101 kg)  07/06/14 179 lb (81.194 kg)  05/07/14 211 lb (95.709 kg)   BP Readings from Last 3 Encounters:  09/10/14 140/74  07/06/14 138/76  05/07/14 128/90     Review of Systems  Constitutional: Positive for unexpected weight change. Negative for appetite change and fatigue.  HENT: Negative for congestion, nosebleeds, sneezing, sore throat and trouble swallowing.   Eyes: Negative for itching and visual disturbance.  Respiratory: Negative for cough.   Cardiovascular: Negative for chest pain, palpitations and leg swelling.  Gastrointestinal: Negative for nausea, diarrhea, blood in stool and abdominal distention.  Genitourinary: Negative for frequency and hematuria.  Musculoskeletal: Positive for arthralgias, back pain and gait problem. Negative for joint swelling and neck pain.  Skin: Negative for rash.  Neurological: Negative for dizziness, tremors, speech difficulty and weakness.  Psychiatric/Behavioral: Negative for suicidal ideas, sleep disturbance, dysphoric mood and agitation. The patient is not nervous/anxious.        Objective:   Physical Exam  Constitutional: He is oriented to person, place, and time. He appears well-developed. No distress.  NAD Less obese  HENT:  Mouth/Throat: Oropharynx is clear and moist.  Eyes: Conjunctivae  are normal. Pupils are equal, round, and reactive to light.  Neck: Normal range of motion. No JVD present. No thyromegaly present.  Cardiovascular: Normal rate, regular rhythm, normal heart sounds and intact distal pulses.  Exam reveals no gallop and no friction rub.   No murmur heard. Pulmonary/Chest: Effort normal and breath sounds normal. No respiratory distress. He has no wheezes. He has no rales. He exhibits no tenderness.  Abdominal: Soft. Bowel sounds are normal. He exhibits no distension and no mass. There is no tenderness. There is no rebound and no guarding.  Musculoskeletal: Normal range of motion. He exhibits tenderness. He exhibits no edema.  Lymphadenopathy:    He has no cervical adenopathy.  Neurological: He is alert and oriented to person, place, and time. He has normal reflexes. No cranial nerve deficit. He exhibits normal muscle tone. He displays a negative Romberg sign. Coordination and gait normal.  No meningeal signs  Skin: Skin is warm and dry. No rash noted.  Psychiatric: He has a normal mood and affect. His behavior is normal. Judgment and thought content normal.  LS is tender w/ROM  Lab Results  Component Value Date   WBC 4.8 04/02/2014   HGB 13.3 04/02/2014   HCT 38.9* 04/02/2014   PLT 225.0 04/02/2014   GLUCOSE 103* 04/02/2014   CHOL 153 04/02/2014   TRIG 48.0 04/02/2014   HDL 58.60 04/02/2014   LDLDIRECT 70.7 01/19/2009   LDLCALC 85 04/02/2014   ALT 47 04/02/2014   AST 37 04/02/2014   NA 134* 04/02/2014   K 4.1 04/02/2014   CL  93* 04/02/2014   CREATININE 0.9 04/02/2014   BUN 11 04/02/2014   CO2 31 04/02/2014   TSH 1.32 04/02/2014   PSA 0.53 04/02/2014   HGBA1C 5.6 04/02/2014         Assessment & Plan:

## 2014-09-10 NOTE — Assessment & Plan Note (Signed)
CT r/o kidney stone Labs Toradol po prn

## 2014-09-10 NOTE — Assessment & Plan Note (Signed)
Ceftin 250 bid x 10 d

## 2014-09-13 ENCOUNTER — Telehealth: Payer: Self-pay | Admitting: Internal Medicine

## 2014-09-13 ENCOUNTER — Ambulatory Visit (INDEPENDENT_AMBULATORY_CARE_PROVIDER_SITE_OTHER)
Admission: RE | Admit: 2014-09-13 | Discharge: 2014-09-13 | Disposition: A | Discharge status: Home / Self Care | Payer: 59 | Source: Ambulatory Visit | Attending: Internal Medicine | Admitting: Internal Medicine

## 2014-09-13 DIAGNOSIS — R109 Unspecified abdominal pain: Secondary | ICD-10-CM

## 2014-09-13 NOTE — Telephone Encounter (Signed)
Patient states he came in to see Dr. Alain Marion Friday.  He is requesting a cough med to be called in to [facility] on [address] and a doctors note to excuse him from work for today and tomorrow.

## 2014-09-13 NOTE — Telephone Encounter (Signed)
OK a note Tessalon 200 mg tid prn cough #40 1 ref Thx

## 2014-09-14 ENCOUNTER — Telehealth: Payer: Self-pay | Admitting: Internal Medicine

## 2014-09-14 DIAGNOSIS — J189 Pneumonia, unspecified organism: Secondary | ICD-10-CM

## 2014-09-14 DIAGNOSIS — J181 Lobar pneumonia, unspecified organism: Principal | ICD-10-CM

## 2014-09-14 MED ORDER — BENZONATATE 200 MG PO CAPS: 200.0000 mg | ORAL_CAPSULE | Freq: Three times a day (TID) | ORAL | Status: DC | PRN

## 2014-09-14 NOTE — Telephone Encounter (Signed)
Left mess for patient to call back.  

## 2014-09-14 NOTE — Telephone Encounter (Signed)
Notified pt md ok excuse note ready for pick-up also tessalon pearls has been sent to [facility]...Ronnie Curtis

## 2014-09-14 NOTE — Telephone Encounter (Signed)
I called pt and answered all questions to the best of my ability and read MD's note to pt and radiologists interpretation. CXR order placed.

## 2014-09-14 NOTE — Telephone Encounter (Signed)
Pt states he has recently received his CT results in mychart and did not understand the terms provided by Dr Alain Marion. Pt would like to be contacted and provided more specific details on behalf of his results.

## 2014-10-06 ENCOUNTER — Other Ambulatory Visit: Payer: Self-pay | Admitting: Internal Medicine

## 2014-10-19 ENCOUNTER — Ambulatory Visit (INDEPENDENT_AMBULATORY_CARE_PROVIDER_SITE_OTHER)
Admission: RE | Admit: 2014-10-19 | Discharge: 2014-10-19 | Disposition: A | Discharge status: Home / Self Care | Payer: 59 | Source: Ambulatory Visit | Attending: Internal Medicine | Admitting: Internal Medicine

## 2014-10-19 DIAGNOSIS — J189 Pneumonia, unspecified organism: Secondary | ICD-10-CM

## 2014-10-19 DIAGNOSIS — J181 Lobar pneumonia, unspecified organism: Principal | ICD-10-CM

## 2014-11-08 ENCOUNTER — Ambulatory Visit (INDEPENDENT_AMBULATORY_CARE_PROVIDER_SITE_OTHER): Payer: 59 | Admitting: Internal Medicine

## 2014-11-08 ENCOUNTER — Encounter: Payer: Self-pay | Admitting: Internal Medicine

## 2014-11-08 VITALS — BP 140/70 | HR 60 | Temp 98.3°F | Wt 184.0 lb

## 2014-11-08 DIAGNOSIS — M5440 Lumbago with sciatica, unspecified side: Secondary | ICD-10-CM

## 2014-11-08 DIAGNOSIS — I10 Essential (primary) hypertension: Secondary | ICD-10-CM

## 2014-11-08 NOTE — Assessment & Plan Note (Signed)
Off opioids

## 2014-11-08 NOTE — Progress Notes (Signed)
Subjective:    HPI    Pt lost more wt on Zola's diet (124 lbs total)!  The patient presents for a follow-up of  chronic LBP, chronic hypogonadism, HTN previously controlled with medicines. He stopped Maxzide due to cramps - resolved. He is working as a Astronomer.  He smoked pot once prior to previous drug screen - then decided to "detox" himself and did not take oxycodone (he threw it away because he was angry with himself). He hasn't used pot any more.  F/u  back pain w/o pain meds.  Wt Readings from Last 3 Encounters:  11/08/14 184 lb (83.462 kg)  09/10/14 181 lb (82.101 kg)  07/06/14 179 lb (81.194 kg)   BP Readings from Last 3 Encounters:  11/08/14 140/70  09/10/14 140/74  07/06/14 138/76     Review of Systems  Constitutional: Positive for unexpected weight change. Negative for appetite change and fatigue.  HENT: Negative for congestion, nosebleeds, sneezing, sore throat and trouble swallowing.   Eyes: Negative for itching and visual disturbance.  Respiratory: Negative for cough.   Cardiovascular: Negative for chest pain, palpitations and leg swelling.  Gastrointestinal: Negative for nausea, diarrhea, blood in stool and abdominal distention.  Genitourinary: Negative for frequency and hematuria.  Musculoskeletal: Positive for back pain, arthralgias and gait problem. Negative for joint swelling and neck pain.  Skin: Negative for rash.  Neurological: Negative for dizziness, tremors, speech difficulty and weakness.  Psychiatric/Behavioral: Negative for suicidal ideas, sleep disturbance, dysphoric mood and agitation. The patient is not nervous/anxious.        Objective:   Physical Exam  Constitutional: He is oriented to person, place, and time. He appears well-developed. No distress.  NAD  HENT:  Mouth/Throat: Oropharynx is clear and moist.  Eyes: Conjunctivae are normal. Pupils are equal, round, and reactive to light.  Neck: Normal range of motion. No JVD  present. No thyromegaly present.  Cardiovascular: Normal rate, regular rhythm, normal heart sounds and intact distal pulses.  Exam reveals no gallop and no friction rub.   No murmur heard. Pulmonary/Chest: Effort normal and breath sounds normal. No respiratory distress. He has no wheezes. He has no rales. He exhibits no tenderness.  Abdominal: Soft. Bowel sounds are normal. He exhibits no distension and no mass. There is no tenderness. There is no rebound and no guarding.  Musculoskeletal: Normal range of motion. He exhibits no edema or tenderness.  Lymphadenopathy:    He has no cervical adenopathy.  Neurological: He is alert and oriented to person, place, and time. He has normal reflexes. No cranial nerve deficit. He exhibits normal muscle tone. He displays a negative Romberg sign. Coordination and gait normal.  No meningeal signs  Skin: Skin is warm and dry. No rash noted.  Psychiatric: He has a normal mood and affect. His behavior is normal. Judgment and thought content normal.  LS is tender w/ROM  Lab Results  Component Value Date   WBC 8.6 09/10/2014   HGB 14.0 09/10/2014   HCT 42.1 09/10/2014   PLT 221.0 09/10/2014   GLUCOSE 98 09/10/2014   CHOL 153 04/02/2014   TRIG 48.0 04/02/2014   HDL 58.60 04/02/2014   LDLDIRECT 70.7 01/19/2009   LDLCALC 85 04/02/2014   ALT 47 04/02/2014   AST 37 04/02/2014   NA 132* 09/10/2014   K 3.6 09/10/2014   CL 94* 09/10/2014   CREATININE 0.8 09/10/2014   BUN 10 09/10/2014   CO2 23 09/10/2014   TSH 1.32 04/02/2014   PSA  0.53 04/02/2014   HGBA1C 5.6 04/02/2014         Assessment & Plan:

## 2014-11-08 NOTE — Progress Notes (Signed)
Pre visit review using our clinic review tool, if applicable. No additional management support is needed unless otherwise documented below in the visit note. 

## 2014-11-08 NOTE — Assessment & Plan Note (Signed)
Continue with current prescription therapy as reflected on the Med list.  

## 2014-11-08 NOTE — Assessment & Plan Note (Signed)
Resolved w/wt loss 

## 2015-01-26 ENCOUNTER — Other Ambulatory Visit: Payer: Self-pay | Admitting: Internal Medicine

## 2015-02-27 ENCOUNTER — Encounter (HOSPITAL_COMMUNITY): Payer: Self-pay | Admitting: *Deleted

## 2015-02-27 ENCOUNTER — Emergency Department (HOSPITAL_COMMUNITY)
Admission: EM | Admit: 2015-02-27 | Discharge: 2015-02-27 | Disposition: A | Discharge status: Home / Self Care | Payer: 59 | Attending: Emergency Medicine | Admitting: Emergency Medicine

## 2015-02-27 DIAGNOSIS — Z8659 Personal history of other mental and behavioral disorders: Secondary | ICD-10-CM | POA: Diagnosis not present

## 2015-02-27 DIAGNOSIS — K219 Gastro-esophageal reflux disease without esophagitis: Secondary | ICD-10-CM | POA: Insufficient documentation

## 2015-02-27 DIAGNOSIS — Z7982 Long term (current) use of aspirin: Secondary | ICD-10-CM | POA: Diagnosis not present

## 2015-02-27 DIAGNOSIS — S0502XA Injury of conjunctiva and corneal abrasion without foreign body, left eye, initial encounter: Secondary | ICD-10-CM | POA: Diagnosis not present

## 2015-02-27 DIAGNOSIS — H5712 Ocular pain, left eye: Secondary | ICD-10-CM | POA: Diagnosis present

## 2015-02-27 DIAGNOSIS — R011 Cardiac murmur, unspecified: Secondary | ICD-10-CM | POA: Insufficient documentation

## 2015-02-27 DIAGNOSIS — Y9289 Other specified places as the place of occurrence of the external cause: Secondary | ICD-10-CM | POA: Insufficient documentation

## 2015-02-27 DIAGNOSIS — J45909 Unspecified asthma, uncomplicated: Secondary | ICD-10-CM | POA: Diagnosis not present

## 2015-02-27 DIAGNOSIS — Z9981 Dependence on supplemental oxygen: Secondary | ICD-10-CM | POA: Diagnosis not present

## 2015-02-27 DIAGNOSIS — G8929 Other chronic pain: Secondary | ICD-10-CM | POA: Insufficient documentation

## 2015-02-27 DIAGNOSIS — M1990 Unspecified osteoarthritis, unspecified site: Secondary | ICD-10-CM | POA: Insufficient documentation

## 2015-02-27 DIAGNOSIS — E669 Obesity, unspecified: Secondary | ICD-10-CM | POA: Diagnosis not present

## 2015-02-27 DIAGNOSIS — Y9389 Activity, other specified: Secondary | ICD-10-CM | POA: Insufficient documentation

## 2015-02-27 DIAGNOSIS — Z86018 Personal history of other benign neoplasm: Secondary | ICD-10-CM | POA: Insufficient documentation

## 2015-02-27 DIAGNOSIS — I10 Essential (primary) hypertension: Secondary | ICD-10-CM | POA: Insufficient documentation

## 2015-02-27 DIAGNOSIS — Z8701 Personal history of pneumonia (recurrent): Secondary | ICD-10-CM | POA: Diagnosis not present

## 2015-02-27 DIAGNOSIS — X58XXXA Exposure to other specified factors, initial encounter: Secondary | ICD-10-CM | POA: Insufficient documentation

## 2015-02-27 DIAGNOSIS — Z7951 Long term (current) use of inhaled steroids: Secondary | ICD-10-CM | POA: Diagnosis not present

## 2015-02-27 DIAGNOSIS — G4733 Obstructive sleep apnea (adult) (pediatric): Secondary | ICD-10-CM | POA: Insufficient documentation

## 2015-02-27 DIAGNOSIS — Z79899 Other long term (current) drug therapy: Secondary | ICD-10-CM | POA: Diagnosis not present

## 2015-02-27 DIAGNOSIS — Y998 Other external cause status: Secondary | ICD-10-CM | POA: Insufficient documentation

## 2015-02-27 MED ORDER — NAPROXEN 500 MG PO TABS
500.0000 mg | ORAL_TABLET | Freq: Two times a day (BID) | ORAL | Status: DC
Start: 2015-02-27 — End: 2020-08-31

## 2015-02-27 MED ORDER — TETRACAINE HCL 0.5 % OP SOLN
1.0000 [drp] | Freq: Once | OPHTHALMIC | Status: AC
  Administered 2015-02-27: 1 [drp] via OPHTHALMIC
  Filled 2015-02-27: qty 2

## 2015-02-27 MED ORDER — FLUORESCEIN SODIUM 1 MG OP STRP
1.0000 | ORAL_STRIP | Freq: Once | OPHTHALMIC | Status: AC
  Administered 2015-02-27: 1 via OPHTHALMIC
  Filled 2015-02-27: qty 1

## 2015-02-27 MED ORDER — MOXIFLOXACIN HCL 0.5 % OP SOLN: 2.0000 [drp] | Freq: Every day | OPHTHALMIC | Status: DC

## 2015-02-27 NOTE — ED Notes (Addendum)
Pt reports eye pain started on SAT. This morning his LT eye was  Swollen. Pt reports his astigmatism Bil. Eyes . He also uses reading glasses .

## 2015-02-27 NOTE — ED Notes (Signed)
Declined W/C at D/C and was escorted to lobby by RN. 

## 2015-02-27 NOTE — ED Provider Notes (Signed)
CSN: [id number]     Arrival date & time 02/27/15  2947 History   First MD Initiated Contact with Patient 02/27/15 [phone]     Chief Complaint  Patient presents with  . Eye Problem   (Consider location/radiation/quality/duration/timing/severity/associated sxs/prior Treatment) HPI Ronnie Curtis is a 64 yo male presenting with eye pain and redness.  Patient states he first started feeling eye irritation yesterday as a something was in his eye. He states he wears hard contact and it normally is slightly uncomfortable because the lumens is ill fitting, however yesterday the sensation of foreign body was more bothersome. He notes this is associated with his eye becoming more red, clear tearing, and a burning sensation. He denies any purulent discharge or crusting along the lid margins after sleeping. He denies any fevers, headaches, nausea or vomiting or changes in vision.  Past Medical History  Diagnosis Date  . GERD (gastroesophageal reflux disease)   . Retinal detachment     "multiple"  . Multiple polyps of ethmoid sinus   . Obesity   . HTN (hypertension)   . Asthmatic bronchitis   . Anxiety     Panic disorder  . Allergy     grass, mold, dust  . Hypogonadism male   . Tubular adenoma of colon     2 in 01-Mar-2004 and 3 in 03/02/07  . Hemorrhoids   . OSA (obstructive sleep apnea)     PSG 05/24/10 AHI 90, CPAP 13cm H2O- hasn't used CPAP in years Mar 01, 2014)  . Heart murmur     hx of  . Pneumonia     "couple times" (04/13/2014)  . Arthritis     "back" (04/13/2014)  . Chronic lower back pain   . Depression ~ March 02, 2007    "when granddaughter died"    Past Surgical History  Procedure Laterality Date  . Carpal tunnel release Left   . Urethrotomy      x3  . Tonsillectomy and adenoidectomy    . Cataract extraction w/ intraocular lens  implant, bilateral  03-02-03  . Colonoscopy w/ biopsies      multiple  . Nasal septum surgery  05/14/2005    bilateral polypectomy, bilateral total ethmoidectomy, left frontal  sinusotomy  . Vasectomy    . Retinal detachment surgery Left     "4 on the left now; 2 on the right" (04/13/2014)  . Nasal reconstruction with septal repair  1970's X 2  . Pars plana vitrectomy Left 04/13/2014    Procedure: PARS PLANA VITRECTOMY WITH 25 GAUGE;  Surgeon: Hayden Pedro, MD;  Location: [facility];  Service: Ophthalmology;  Laterality: Left;  . Laser photo ablation Left 04/13/2014    Procedure: LASER PHOTO ABLATION;  Surgeon: Hayden Pedro, MD;  Location: [facility];  Service: Ophthalmology;  Laterality: Left;  . Repair of complex traction retinal detachment Left 04/13/2014    Procedure: REPAIR OF COMPLEX TRACTION RETINAL DETACHMENT;  Surgeon: Hayden Pedro, MD;  Location: [facility];  Service: Ophthalmology;  Laterality: Left;   Family History  Problem Relation Age of Onset  . Hypertension Other   . Allergies Mother   . Lymphoma Mother   . Cancer Mother     lymphoma  . Heart disease Father     CAD, CHF  . Hypertension Brother   . Arthritis Brother     OA  . Colon cancer Neg Hx    History  Substance Use Topics  . Smoking status: Never Smoker   . Smokeless tobacco:  Never Used  . Alcohol Use: 1.2 oz/week    2 Cans of beer per week    Review of Systems  Constitutional: Negative for fever.  Eyes: Positive for photophobia, pain and redness. Negative for visual disturbance.  Skin: Negative for rash.  Neurological: Negative for weakness, numbness and headaches.      Allergies  Moviprep; Cialis; Erythromycin; Maxzide; and Spironolactone  Home Medications   Prior to Admission medications   Medication Sig Start Date End Date Taking? Authorizing Provider  aspirin 81 MG EC tablet Take 81 mg by mouth every evening.     Historical Provider, MD  benzonatate (TESSALON) 200 MG capsule Take 1 capsule (200 mg total) by mouth 3 (three) times daily as needed for cough. Patient not taking: Reported on 11/08/2014 09/14/14   Cassandria Anger, MD  Cholecalciferol 1000 UNITS tablet Take  2,000 Units by mouth daily.     Historical Provider, MD  clomiPHENE (CLOMID) 50 MG tablet take 1/2 tablet by mouth once daily 10/08/14   Cassandria Anger, MD  fluticasone (FLONASE) 50 MCG/ACT nasal spray Use 2 sprays in each  nostril daily 01/26/15   Aleksei Plotnikov V, MD  indapamide (LOZOL) 2.5 MG tablet Take 1 tablet (2.5 mg total) by mouth daily. 05/27/14   Aleksei Plotnikov V, MD  ketorolac (TORADOL) 10 MG tablet Take 1 tablet (10 mg total) by mouth every 6 (six) hours as needed for moderate pain or severe pain. Kidney stone pain Patient not taking: Reported on 11/08/2014 09/10/14   Cassandria Anger, MD  Multiple Vitamin (MULTIVITAMIN) tablet Take 1 tablet by mouth daily.    Historical Provider, MD  omeprazole (PRILOSEC) 40 MG capsule Take 40 mg by mouth 2 (two) times daily.    Historical Provider, MD   BP 132/80 mmHg  Pulse 64  Temp(Src) 98 F (36.7 C) (Oral)  Resp 20  SpO2 100% Physical Exam  Constitutional: He is oriented to person, place, and time. He appears well-developed and well-nourished. No distress.  HENT:  Head: Normocephalic and atraumatic.  Eyes: EOM are normal. Pupils are equal, round, and reactive to light. Right eye exhibits no discharge. Left eye exhibits discharge (Clear, tearing). Left conjunctiva is injected. Left conjunctiva has no hemorrhage. No scleral icterus.  Slit lamp exam:      The left eye shows corneal abrasion and fluorescein uptake. The left eye shows no corneal ulcer, no hyphema and no anterior chamber bulge.    Cardiovascular: Intact distal pulses.   Pulmonary/Chest: Effort normal.  Neurological: He is alert and oriented to person, place, and time. Coordination normal.  Skin: He is not diaphoretic.  Nursing note and vitals reviewed.   ED Course  Procedures (including critical care time) Labs Review Labs Reviewed - No data to display  Imaging Review No results found.   EKG Interpretation None      MDM   Final diagnoses:    Corneal abrasion, left, initial encounter    64 yo with burning, redness and foreign body sensation x 1 day.  Exam shows corneal abrasion but no corneal ulcer. Pt wears hard contact lenses that are ill-fitting. Eye irrigated w NS, no evidence of FB.  No change in vision, acuity equal bilaterally. Exam non-concerning for orbital cellulitis, or hyphema.  Patient will be discharged home with vigimox. drops. Pt is well-appearing, in no acute distress and vital signs reviewed and not concerning. He appears safe to be discharged. He understands to follow up with ophthalmology, & to return to  ER if new symptoms develop including change in vision, purulent drainage, or entrapment. Pt aware of plan and in agreement.   Filed Vitals:   02/27/15 0706 02/27/15 0815  BP: 132/80   Pulse: 64   Temp: 98 F (36.7 C)   TempSrc: Oral   Resp: 20   Height:  5\' 8"  (1.727 m)  Weight:  184 lb (83.462 kg)  SpO2: 100%    Meds given in ED:  Medications  tetracaine (PONTOCAINE) 0.5 % ophthalmic solution 1 drop (1 drop Both Eyes Given 02/27/15 0729)  fluorescein ophthalmic strip 1 strip (1 strip Both Eyes Given 02/27/15 0730)    Discharge Medication List as of 02/27/2015  8:10 AM    START taking these medications   Details  moxifloxacin (VIGAMOX) 0.5 % ophthalmic solution Place 2 drops into the left eye daily. EVERY 2 HOURS WHILE AWAKE FOR 2 DAYS, THEN EVERY 4-8 HOURS FOR 5 DAYS, Starting 02/27/2015, Until Discontinued, Print    naproxen (NAPROSYN) 500 MG tablet Take 1 tablet (500 mg total) by mouth 2 (two) times daily., Starting 02/27/2015, Until Discontinued, Print           Britt Bottom, NP 02/27/15 [id number]  Evelina Bucy, MD 02/27/15 [phone]

## 2015-02-27 NOTE — Discharge Instructions (Signed)
Please follow directions provided. Be sure to follow-up with the eye doctor (ophthalmologist) in the next 2 days. Please use the eyedrops as directed in your left eye. May take naproxen twice a day to help with discomfort. Do not wear contacts until you've been cleared by your doctor. Don't hesitate to return for any new, worsening, or concerning symptoms.   SEEK MEDICAL CARE IF:  You have pain, light sensitivity, and a scratchy feeling in one eye or both eyes.  Your pressure patch keeps loosening up, and you can blink your eye under the patch after treatment.  Any kind of discharge develops from the eye after treatment or if the lids stick together in the morning.  You have the same symptoms in the morning as you did with the original abrasion days, weeks, or months after the abrasion healed.

## 2015-04-21 ENCOUNTER — Ambulatory Visit (INDEPENDENT_AMBULATORY_CARE_PROVIDER_SITE_OTHER): Payer: 59 | Admitting: Internal Medicine

## 2015-04-21 ENCOUNTER — Encounter: Payer: Self-pay | Admitting: Internal Medicine

## 2015-04-21 ENCOUNTER — Other Ambulatory Visit: Payer: Self-pay | Admitting: Internal Medicine

## 2015-04-21 VITALS — BP 160/92 | HR 57 | Wt 167.0 lb

## 2015-04-21 DIAGNOSIS — R634 Abnormal weight loss: Secondary | ICD-10-CM | POA: Diagnosis not present

## 2015-04-21 DIAGNOSIS — G4733 Obstructive sleep apnea (adult) (pediatric): Secondary | ICD-10-CM

## 2015-04-21 DIAGNOSIS — E119 Type 2 diabetes mellitus without complications: Secondary | ICD-10-CM

## 2015-04-21 DIAGNOSIS — E291 Testicular hypofunction: Secondary | ICD-10-CM | POA: Diagnosis not present

## 2015-04-21 DIAGNOSIS — M5440 Lumbago with sciatica, unspecified side: Secondary | ICD-10-CM

## 2015-04-21 DIAGNOSIS — Z0000 Encounter for general adult medical examination without abnormal findings: Secondary | ICD-10-CM | POA: Diagnosis not present

## 2015-04-21 NOTE — Assessment & Plan Note (Signed)
Labs

## 2015-04-21 NOTE — Assessment & Plan Note (Signed)
Resolved after wt loss
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
## 2015-04-21 NOTE — Progress Notes (Signed)
Pre visit review using our clinic review tool, if applicable. No additional management support is needed unless otherwise documented below in the visit note. 

## 2015-04-21 NOTE — Assessment & Plan Note (Signed)
OSA probably resolved after a wt loss. Not using CPAP. Needs sleep test for life insurance

## 2015-04-21 NOTE — Assessment & Plan Note (Signed)
[place] [facility] clinic. On Oxy. Monthly UDS

## 2015-04-22 ENCOUNTER — Other Ambulatory Visit (INDEPENDENT_AMBULATORY_CARE_PROVIDER_SITE_OTHER): Payer: 59

## 2015-04-22 DIAGNOSIS — E291 Testicular hypofunction: Secondary | ICD-10-CM

## 2015-04-22 DIAGNOSIS — Z0000 Encounter for general adult medical examination without abnormal findings: Secondary | ICD-10-CM

## 2015-04-22 DIAGNOSIS — R634 Abnormal weight loss: Secondary | ICD-10-CM | POA: Diagnosis not present

## 2015-04-22 LAB — HEPATIC FUNCTION PANEL
ALBUMIN: 4.7 g/dL (ref 3.5–5.2)
ALK PHOS: 37 U/L — AB (ref 39–117)
ALT: 27 U/L (ref 0–53)
AST: 41 U/L — ABNORMAL HIGH (ref 0–37)
BILIRUBIN DIRECT: 0.2 mg/dL (ref 0.0–0.3)
Total Bilirubin: 0.8 mg/dL (ref 0.2–1.2)
Total Protein: 7.6 g/dL (ref 6.0–8.3)

## 2015-04-22 LAB — CBC WITH DIFFERENTIAL/PLATELET
BASOS PCT: 1 % (ref 0.0–3.0)
Basophils Absolute: 0 10*3/uL (ref 0.0–0.1)
Eosinophils Absolute: 0.2 10*3/uL (ref 0.0–0.7)
Eosinophils Relative: 5.5 % — ABNORMAL HIGH (ref 0.0–5.0)
HEMATOCRIT: 44.1 % (ref 39.0–52.0)
HEMOGLOBIN: 15 g/dL (ref 13.0–17.0)
Lymphocytes Relative: 37.8 % (ref 12.0–46.0)
Lymphs Abs: 1.5 10*3/uL (ref 0.7–4.0)
MCHC: 34.1 g/dL (ref 30.0–36.0)
MCV: 95.6 fl (ref 78.0–100.0)
Monocytes Absolute: 0.4 10*3/uL (ref 0.1–1.0)
Monocytes Relative: 9.2 % (ref 3.0–12.0)
Neutro Abs: 1.8 10*3/uL (ref 1.4–7.7)
Neutrophils Relative %: 46.5 % (ref 43.0–77.0)
Platelets: 282 10*3/uL (ref 150.0–400.0)
RBC: 4.62 Mil/uL (ref 4.22–5.81)
RDW: 12.8 % (ref 11.5–15.5)
WBC: 3.9 10*3/uL — AB (ref 4.0–10.5)

## 2015-04-22 LAB — TSH: TSH: 2.08 u[IU]/mL (ref 0.35–4.50)

## 2015-04-22 LAB — URINALYSIS
Bilirubin Urine: NEGATIVE
Hgb urine dipstick: NEGATIVE
Leukocytes, UA: NEGATIVE
Nitrite: NEGATIVE
SPECIFIC GRAVITY, URINE: 1.01 (ref 1.000–1.030)
Total Protein, Urine: NEGATIVE
URINE GLUCOSE: NEGATIVE
Urobilinogen, UA: 0.2 (ref 0.0–1.0)
pH: 7 (ref 5.0–8.0)

## 2015-04-22 LAB — BASIC METABOLIC PANEL
BUN: 10 mg/dL (ref 6–23)
CALCIUM: 9.9 mg/dL (ref 8.4–10.5)
CO2: 29 mEq/L (ref 19–32)
CREATININE: 0.79 mg/dL (ref 0.40–1.50)
Chloride: 91 mEq/L — ABNORMAL LOW (ref 96–112)
GFR: 104.86 mL/min (ref >60.00)
Glucose, Bld: 90 mg/dL (ref 70–99)
POTASSIUM: 3.9 meq/L (ref 3.5–5.1)
Sodium: 130 mEq/L — ABNORMAL LOW (ref 135–145)

## 2015-04-22 LAB — PSA: PSA: 0.3 ng/mL (ref 0.10–4.00)

## 2015-04-22 LAB — HEMOGLOBIN A1C: Hgb A1c MFr Bld: 5.3 % (ref 4.6–6.5)

## 2015-04-22 LAB — VITAMIN B12: Vitamin B-12: 553 pg/mL (ref 211–911)

## 2015-04-26 LAB — TESTOSTERONE, FREE, TOTAL, SHBG
Sex Hormone Binding: 109 nmol/L — ABNORMAL HIGH (ref 22–77)
Testosterone, Free: 64.6 pg/mL (ref 47.0–244.0)
Testosterone-% Free: 0.9 % — ABNORMAL LOW (ref 1.6–2.9)
Testosterone: 727.6 ng/dL (ref 300–890)

## 2015-04-27 LAB — NICOTINE SCREEN, URINE
Cotinine, Urine: <2 ng/mL
Nicotine, urine: <2 ng/mL

## 2015-05-01 ENCOUNTER — Other Ambulatory Visit: Payer: Self-pay | Admitting: Internal Medicine

## 2015-05-09 ENCOUNTER — Other Ambulatory Visit: Payer: Self-pay

## 2015-05-10 ENCOUNTER — Encounter: Payer: 59 | Admitting: Internal Medicine

## 2015-05-11 ENCOUNTER — Encounter: Payer: Self-pay | Admitting: Internal Medicine

## 2015-05-11 ENCOUNTER — Ambulatory Visit (INDEPENDENT_AMBULATORY_CARE_PROVIDER_SITE_OTHER): Payer: 59 | Admitting: Internal Medicine

## 2015-05-11 VITALS — BP 110/72 | HR 77 | Temp 99.0°F | Ht 66.0 in | Wt 170.0 lb

## 2015-05-11 DIAGNOSIS — Z0000 Encounter for general adult medical examination without abnormal findings: Secondary | ICD-10-CM | POA: Diagnosis not present

## 2015-05-11 DIAGNOSIS — I10 Essential (primary) hypertension: Secondary | ICD-10-CM

## 2015-05-11 NOTE — Assessment & Plan Note (Signed)
Monitor BP at home NAS diet 

## 2015-05-11 NOTE — Progress Notes (Signed)
Pre visit review using our clinic review tool, if applicable. No additional management support is needed unless otherwise documented below in the visit note. 

## 2015-05-11 NOTE — Patient Instructions (Signed)
Diet Poweraid at night Tylenol PM for cramps

## 2015-05-11 NOTE — Assessment & Plan Note (Signed)
We discussed age appropriate health related issues, including available/recomended screening tests and vaccinations. We discussed a need for adhering to healthy diet and exercise. Labs/EKG were reviewed/ordered. All questions were answered.   
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
## 2015-05-18 ENCOUNTER — Telehealth: Payer: Self-pay | Admitting: Internal Medicine

## 2015-05-18 NOTE — Telephone Encounter (Signed)
Patient need a copy of his lab work for last visit.

## 2015-05-18 NOTE — Telephone Encounter (Signed)
Lab results mailed.

## 2015-05-31 IMAGING — CR DG CHEST 2V
2 series · 2 of 2 positions shown · non-contrast
Comparison: 12/04/2010.

CLINICAL DATA: Hypertension.  Heart murmur.

EXAM:
CHEST  2 VIEW

[w chest pa]
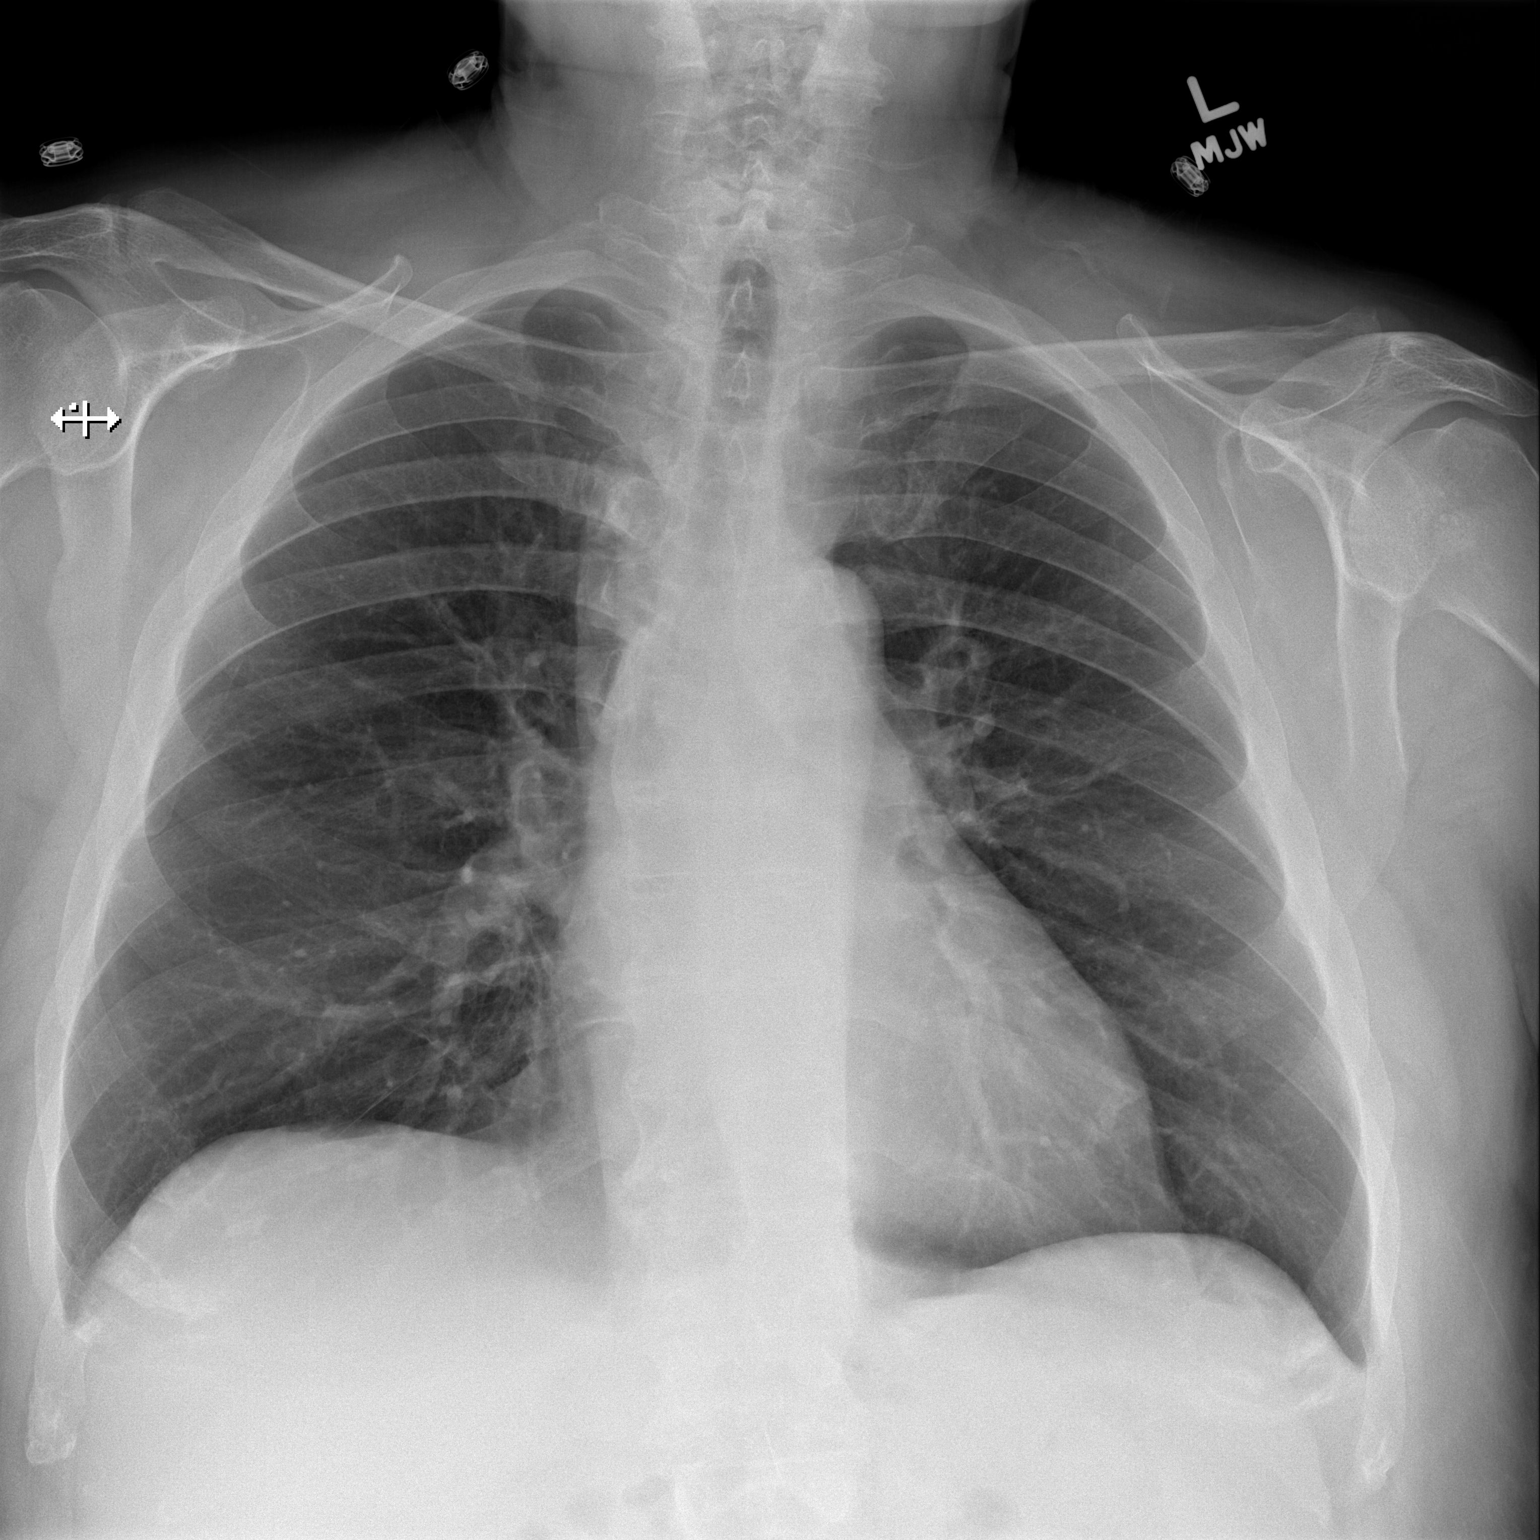

[w chest lat]
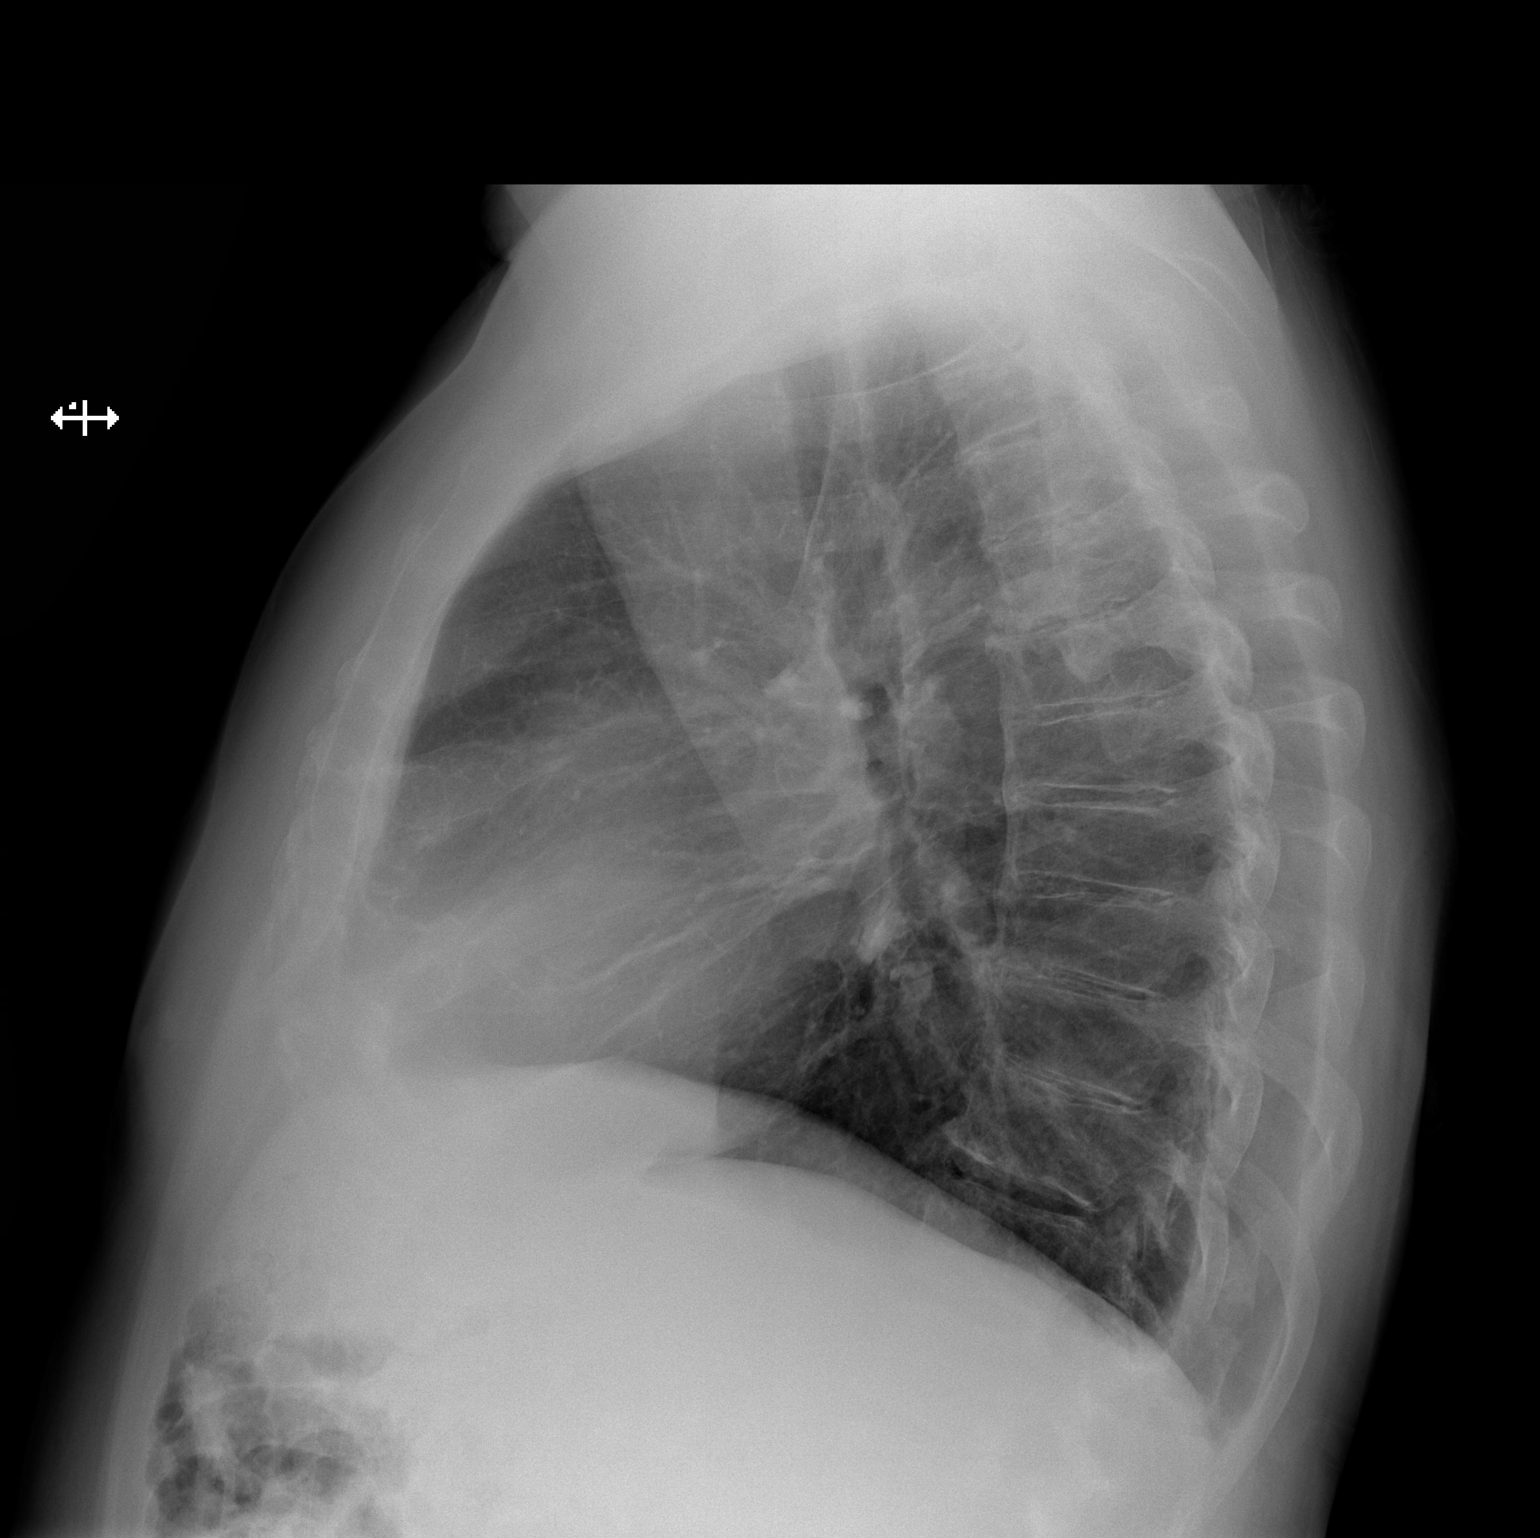

[2 of 2 positions shown; findings below may reference images not displayed]

FINDINGS: Mediastinum and hilar structures normal. Cardiomegaly with normal
pulmonary vascularity. No pleural effusion or pneumothorax. No focal
infiltrate. No acute bony abnormality. Degenerative changes thoracic
spine.
IMPRESSION: No active cardiopulmonary disease.

## 2015-06-09 ENCOUNTER — Other Ambulatory Visit: Payer: Self-pay | Admitting: Internal Medicine

## 2015-06-09 NOTE — Telephone Encounter (Signed)
Ok to [employer]? Med was d/c'd.

## 2015-06-10 ENCOUNTER — Other Ambulatory Visit: Payer: Self-pay

## 2015-06-14 ENCOUNTER — Other Ambulatory Visit: Payer: Self-pay | Admitting: *Deleted

## 2015-06-14 MED ORDER — INDAPAMIDE 2.5 MG PO TABS
2.5000 mg | ORAL_TABLET | Freq: Every day | ORAL | Status: DC
Start: 2015-06-14 — End: 2015-08-02

## 2015-07-21 ENCOUNTER — Ambulatory Visit (INDEPENDENT_AMBULATORY_CARE_PROVIDER_SITE_OTHER): Payer: 59 | Admitting: Ophthalmology

## 2015-07-21 DIAGNOSIS — H33303 Unspecified retinal break, bilateral: Secondary | ICD-10-CM

## 2015-07-21 DIAGNOSIS — H43811 Vitreous degeneration, right eye: Secondary | ICD-10-CM

## 2015-07-21 DIAGNOSIS — H35372 Puckering of macula, left eye: Secondary | ICD-10-CM | POA: Diagnosis not present

## 2015-08-02 ENCOUNTER — Other Ambulatory Visit: Payer: Self-pay | Admitting: Internal Medicine

## 2015-08-09 ENCOUNTER — Other Ambulatory Visit: Payer: Self-pay | Admitting: *Deleted

## 2015-08-09 MED ORDER — CLOMIPHENE CITRATE 50 MG PO TABS: 25.0000 mg | ORAL_TABLET | Freq: Every day | ORAL | Status: DC

## 2015-11-13 HISTORY — PX: OTHER SURGICAL HISTORY: SHX169

## 2016-02-17 ENCOUNTER — Other Ambulatory Visit: Payer: Self-pay | Admitting: Internal Medicine

## 2016-03-13 ENCOUNTER — Encounter: Payer: 59 | Admitting: Internal Medicine

## 2016-04-02 ENCOUNTER — Other Ambulatory Visit: Payer: Self-pay | Admitting: Internal Medicine

## 2016-04-02 ENCOUNTER — Other Ambulatory Visit (INDEPENDENT_AMBULATORY_CARE_PROVIDER_SITE_OTHER): Payer: 59

## 2016-04-02 DIAGNOSIS — E291 Testicular hypofunction: Secondary | ICD-10-CM | POA: Diagnosis not present

## 2016-04-02 DIAGNOSIS — I10 Essential (primary) hypertension: Secondary | ICD-10-CM

## 2016-04-02 DIAGNOSIS — Z0000 Encounter for general adult medical examination without abnormal findings: Secondary | ICD-10-CM | POA: Diagnosis not present

## 2016-04-02 DIAGNOSIS — E119 Type 2 diabetes mellitus without complications: Secondary | ICD-10-CM

## 2016-04-02 LAB — COMPREHENSIVE METABOLIC PANEL
ALBUMIN: 4.2 g/dL (ref 3.5–5.2)
ALT: 43 U/L (ref 0–53)
AST: 43 U/L — ABNORMAL HIGH (ref 0–37)
Alkaline Phosphatase: 33 U/L — ABNORMAL LOW (ref 39–117)
BILIRUBIN TOTAL: 0.8 mg/dL (ref 0.2–1.2)
BUN: 15 mg/dL (ref 6–23)
CALCIUM: 9.7 mg/dL (ref 8.4–10.5)
CHLORIDE: 101 meq/L (ref 96–112)
CO2: 30 mEq/L (ref 19–32)
CREATININE: 0.79 mg/dL (ref 0.40–1.50)
GFR: 104.55 mL/min (ref >60.00)
Glucose, Bld: 104 mg/dL — ABNORMAL HIGH (ref 70–99)
Potassium: 4.4 mEq/L (ref 3.5–5.1)
SODIUM: 138 meq/L (ref 135–145)
TOTAL PROTEIN: 6.9 g/dL (ref 6.0–8.3)

## 2016-04-02 LAB — CBC WITH DIFFERENTIAL/PLATELET
BASOS PCT: 1.3 % (ref 0.0–3.0)
Basophils Absolute: 0.1 10*3/uL (ref 0.0–0.1)
EOS ABS: 0.4 10*3/uL (ref 0.0–0.7)
EOS PCT: 7.7 % — AB (ref 0.0–5.0)
HCT: 37.2 % — ABNORMAL LOW (ref 39.0–52.0)
HEMOGLOBIN: 12.8 g/dL — AB (ref 13.0–17.0)
LYMPHS ABS: 1.4 10*3/uL (ref 0.7–4.0)
Lymphocytes Relative: 30.3 % (ref 12.0–46.0)
MCHC: 34.4 g/dL (ref 30.0–36.0)
MCV: 98.4 fl (ref 78.0–100.0)
MONO ABS: 0.5 10*3/uL (ref 0.1–1.0)
Monocytes Relative: 10.5 % (ref 3.0–12.0)
NEUTROS ABS: 2.3 10*3/uL (ref 1.4–7.7)
NEUTROS PCT: 50.2 % (ref 43.0–77.0)
PLATELETS: 204 10*3/uL (ref 150.0–400.0)
RBC: 3.78 Mil/uL — ABNORMAL LOW (ref 4.22–5.81)
RDW: 13.2 % (ref 11.5–15.5)
WBC: 4.6 10*3/uL (ref 4.0–10.5)

## 2016-04-02 LAB — URINALYSIS, ROUTINE W REFLEX MICROSCOPIC
Bilirubin Urine: NEGATIVE
Hgb urine dipstick: NEGATIVE
Ketones, ur: NEGATIVE
Leukocytes, UA: NEGATIVE
Nitrite: NEGATIVE
PH: 8 (ref 5.0–8.0)
RBC / HPF: NONE SEEN (ref 0–?)
SPECIFIC GRAVITY, URINE: 1.01 (ref 1.000–1.030)
Total Protein, Urine: NEGATIVE
Urine Glucose: NEGATIVE
Urobilinogen, UA: 0.2 (ref 0.0–1.0)

## 2016-04-02 LAB — LIPID PANEL
Cholesterol: 181 mg/dL (ref 0–200)
HDL: 76.4 mg/dL (ref >39.00)
LDL Cholesterol: 94 mg/dL (ref 0–99)
NonHDL: 105.02
TRIGLYCERIDES: 56 mg/dL (ref 0.0–149.0)
Total CHOL/HDL Ratio: 2
VLDL: 11.2 mg/dL (ref 0.0–40.0)

## 2016-04-02 LAB — TSH: TSH: 2.35 u[IU]/mL (ref 0.35–4.50)

## 2016-04-02 LAB — HEMOGLOBIN A1C: HEMOGLOBIN A1C: 5.5 % (ref 4.6–6.5)

## 2016-04-02 LAB — PSA: PSA: 0.41 ng/mL (ref 0.10–4.00)

## 2016-04-02 LAB — TESTOSTERONE: Testosterone: 428.86 ng/dL (ref 300.00–890.00)

## 2016-04-02 LAB — VITAMIN B12: VITAMIN B 12: 269 pg/mL (ref 211–911)

## 2016-04-02 NOTE — Addendum Note (Signed)
Addended by: Cresenciano Lick on: 04/02/2016 10:14 AM   Modules accepted: Orders

## 2016-04-03 LAB — NICOTINE/COTININE METABOLITES

## 2016-04-12 ENCOUNTER — Ambulatory Visit (INDEPENDENT_AMBULATORY_CARE_PROVIDER_SITE_OTHER): Payer: 59 | Admitting: Internal Medicine

## 2016-04-12 ENCOUNTER — Encounter: Payer: Self-pay | Admitting: Internal Medicine

## 2016-04-12 VITALS — BP 140/68 | HR 64 | Ht 69.0 in | Wt 221.0 lb

## 2016-04-12 DIAGNOSIS — Z860100 Personal history of colon polyps, unspecified: Secondary | ICD-10-CM | POA: Insufficient documentation

## 2016-04-12 DIAGNOSIS — Z23 Encounter for immunization: Secondary | ICD-10-CM | POA: Diagnosis not present

## 2016-04-12 DIAGNOSIS — I10 Essential (primary) hypertension: Secondary | ICD-10-CM | POA: Diagnosis not present

## 2016-04-12 DIAGNOSIS — E119 Type 2 diabetes mellitus without complications: Secondary | ICD-10-CM | POA: Diagnosis not present

## 2016-04-12 DIAGNOSIS — E291 Testicular hypofunction: Secondary | ICD-10-CM | POA: Diagnosis not present

## 2016-04-12 DIAGNOSIS — Z0000 Encounter for general adult medical examination without abnormal findings: Secondary | ICD-10-CM | POA: Diagnosis not present

## 2016-04-12 MED ORDER — VITAMIN B-12 500 MCG SL SUBL: SUBLINGUAL_TABLET | SUBLINGUAL | Status: DC

## 2016-04-12 MED ORDER — OMEPRAZOLE 40 MG PO CPDR: 40.0000 mg | DELAYED_RELEASE_CAPSULE | Freq: Two times a day (BID) | ORAL | Status: DC

## 2016-04-12 NOTE — Assessment & Plan Note (Signed)
Labs

## 2016-04-12 NOTE — Assessment & Plan Note (Signed)
Indapamide NAS diet

## 2016-04-12 NOTE — Progress Notes (Signed)
Subjective:  Patient ID: Ronnie Curtis, male    DOB: 31-May-1951  Age: 65 y.o. MRN: [id number]  CC: Annual Exam   HPI Ronnie Curtis presents for a new [facility] well exam  Outpatient Prescriptions Prior to Visit  Medication Sig Dispense Refill  . aspirin 81 MG EC tablet Take 81 mg by mouth every evening.     . Cholecalciferol 1000 UNITS tablet Take 2,000 Units by mouth daily.     . clomiPHENE (CLOMID) 50 MG tablet Take 0.5 tablets (25 mg total) by mouth daily. 45 tablet 2  . fluticasone (FLONASE) 50 MCG/ACT nasal spray Use 2 sprays in each  nostril daily 48 g 1  . indapamide (LOZOL) 2.5 MG tablet Take 1 tablet by mouth  daily 90 tablet 3  . omeprazole (PRILOSEC) 40 MG capsule Take 40 mg by mouth 2 (two) times daily.    Marland Kitchen oxyCODONE (ROXICODONE) 15 MG immediate release tablet 1 tablet.  0  . naproxen (NAPROSYN) 500 MG tablet Take 1 tablet (500 mg total) by mouth 2 (two) times daily. (Patient not taking: Reported on 04/12/2016) 30 tablet 0   No facility-administered medications prior to visit.    ROS Review of Systems  Constitutional: Negative for appetite change, fatigue and unexpected weight change.  HENT: Negative for congestion, nosebleeds, sneezing, sore throat and trouble swallowing.   Eyes: Negative for itching and visual disturbance.  Respiratory: Negative for cough.   Cardiovascular: Negative for chest pain, palpitations and leg swelling.  Gastrointestinal: Negative for nausea, diarrhea, blood in stool and abdominal distention.  Genitourinary: Negative for frequency and hematuria.  Musculoskeletal: Negative for back pain, joint swelling, gait problem and neck pain.  Skin: Negative for rash.  Neurological: Negative for dizziness, tremors, speech difficulty and weakness.  Psychiatric/Behavioral: Negative for sleep disturbance, dysphoric mood and agitation. The patient is nervous/anxious.     Objective:  BP 140/68 mmHg  Pulse 64  Ht 5\' 9"  (1.753 m)  Wt 221 lb (100.245  kg)  BMI 32.62 kg/m2  SpO2 97%  BP Readings from Last 3 Encounters:  04/12/16 140/68  05/11/15 110/72  04/21/15 160/92    Wt Readings from Last 3 Encounters:  04/12/16 221 lb (100.245 kg)  05/11/15 170 lb (77.111 kg)  04/21/15 167 lb (75.751 kg)    Physical Exam  Constitutional: He is oriented to person, place, and time. He appears well-developed. No distress.  NAD  HENT:  Mouth/Throat: Oropharynx is clear and moist.  Eyes: Conjunctivae are normal. Pupils are equal, round, and reactive to light.  Neck: Normal range of motion. No JVD present. No thyromegaly present.  Cardiovascular: Normal rate, regular rhythm, normal heart sounds and intact distal pulses.  Exam reveals no gallop and no friction rub.   No murmur heard. Pulmonary/Chest: Effort normal and breath sounds normal. No respiratory distress. He has no wheezes. He has no rales. He exhibits no tenderness.  Abdominal: Soft. Bowel sounds are normal. He exhibits no distension and no mass. There is no tenderness. There is no rebound and no guarding.  Musculoskeletal: Normal range of motion. He exhibits no edema or tenderness.  Lymphadenopathy:    He has no cervical adenopathy.  Neurological: He is alert and oriented to person, place, and time. He has normal reflexes. No cranial nerve deficit. He exhibits normal muscle tone. He displays a negative Romberg sign. Coordination and gait normal.  Skin: Skin is warm and dry. No rash noted.  Psychiatric: He has a normal mood and affect. His  behavior is normal. Judgment and thought content normal.  Rectal per Dr Carlean Purl - pt deferred   Lab Results  Component Value Date   WBC 4.6 04/02/2016   HGB 12.8* 04/02/2016   HCT 37.2* 04/02/2016   PLT 204.0 04/02/2016   GLUCOSE 104* 04/02/2016   CHOL 181 04/02/2016   TRIG 56.0 04/02/2016   HDL 76.40 04/02/2016   LDLDIRECT 70.7 01/19/2009   LDLCALC 94 04/02/2016   ALT 43 04/02/2016   AST 43* 04/02/2016   NA 138 04/02/2016   K 4.4  04/02/2016   CL 101 04/02/2016   CREATININE 0.79 04/02/2016   BUN 15 04/02/2016   CO2 30 04/02/2016   TSH 2.35 04/02/2016   PSA 0.41 04/02/2016   HGBA1C 5.5 04/02/2016    No results found.  Assessment & Plan:   There are no diagnoses linked to this encounter. I am having Mr. Manganelli maintain his aspirin, Cholecalciferol, omeprazole, naproxen, oxyCODONE, indapamide, clomiPHENE, and fluticasone.  No orders of the defined types were placed in this encounter.     Follow-up: No Follow-up on file.  Walker Kehr, MD

## 2016-04-12 NOTE — Assessment & Plan Note (Signed)
Will sch colonoscopy w/Dr Carlean Purl

## 2016-04-12 NOTE — Patient Instructions (Signed)

## 2016-04-12 NOTE — Assessment & Plan Note (Signed)
On Clomid F/u w/Dr Loanne Drilling

## 2016-04-12 NOTE — Assessment & Plan Note (Signed)

## 2016-04-12 NOTE — Progress Notes (Signed)
Pre visit review using our clinic review tool, if applicable. No additional management support is needed unless otherwise documented below in the visit note. 

## 2016-04-16 ENCOUNTER — Telehealth: Payer: Self-pay

## 2016-04-16 NOTE — Telephone Encounter (Signed)
Pls change to a capsule Thx

## 2016-04-16 NOTE — Telephone Encounter (Signed)
Ok to change to once daily

## 2016-04-16 NOTE — Telephone Encounter (Signed)
PA for Prilosec was initiated via [employer] fax and DENIED - Pt's plan covers once daily capsule, without exceptions.

## 2016-04-16 NOTE — Telephone Encounter (Signed)
Ok Thx 

## 2016-04-17 MED ORDER — OMEPRAZOLE 40 MG PO CPDR: 40.0000 mg | DELAYED_RELEASE_CAPSULE | Freq: Every day | ORAL | Status: DC

## 2016-04-25 ENCOUNTER — Encounter: Payer: Self-pay | Admitting: Internal Medicine

## 2016-05-14 ENCOUNTER — Encounter: Payer: Self-pay | Admitting: Internal Medicine

## 2016-05-22 ENCOUNTER — Other Ambulatory Visit: Payer: Self-pay | Admitting: Internal Medicine

## 2016-06-19 ENCOUNTER — Encounter: Payer: Self-pay | Admitting: Internal Medicine

## 2016-06-19 ENCOUNTER — Ambulatory Visit (AMBULATORY_SURGERY_CENTER): Payer: Self-pay

## 2016-06-19 VITALS — Ht 66.0 in | Wt 238.8 lb

## 2016-06-19 DIAGNOSIS — Z86010 Personal history of colonic polyps: Secondary | ICD-10-CM

## 2016-06-19 NOTE — Progress Notes (Signed)
Per pt, no allergies to soy or egg products.Pt not taking any weight loss meds or using  O2 at home. 

## 2016-07-03 ENCOUNTER — Ambulatory Visit (AMBULATORY_SURGERY_CENTER): Payer: 59 | Admitting: Internal Medicine

## 2016-07-03 ENCOUNTER — Encounter: Payer: Self-pay | Admitting: Internal Medicine

## 2016-07-03 VITALS — BP 146/72 | HR 45 | Temp 98.7°F | Resp 13 | Ht 66.0 in | Wt 238.0 lb

## 2016-07-03 DIAGNOSIS — Z86010 Personal history of colonic polyps: Secondary | ICD-10-CM

## 2016-07-03 DIAGNOSIS — D125 Benign neoplasm of sigmoid colon: Secondary | ICD-10-CM | POA: Diagnosis not present

## 2016-07-03 DIAGNOSIS — D124 Benign neoplasm of descending colon: Secondary | ICD-10-CM | POA: Diagnosis not present

## 2016-07-03 DIAGNOSIS — D120 Benign neoplasm of cecum: Secondary | ICD-10-CM

## 2016-07-03 DIAGNOSIS — D123 Benign neoplasm of transverse colon: Secondary | ICD-10-CM | POA: Diagnosis not present

## 2016-07-03 MED ORDER — SODIUM CHLORIDE 0.9 % IV SOLN: 500.0000 mL | INTRAVENOUS | Status: DC

## 2016-07-03 NOTE — Progress Notes (Signed)
Patient awakening,vss,report to rn 

## 2016-07-03 NOTE — Progress Notes (Signed)
Called to room to assist during endoscopic procedure.  Patient ID and intended procedure confirmed with present staff. Received instructions for my participation in the procedure from the performing physician.  

## 2016-07-03 NOTE — Op Note (Signed)
[facility] Patient Name: Ronnie Curtis Procedure Date: 07/03/2016 11:08 AM MRN: [id number] Endoscopist: Gatha Mayer , MD Age: 65 Referring MD:  Date of Birth: 1950-11-19 Gender: Male Account #: [id number] Procedure:                Colonoscopy Indications:              Surveillance: Personal history of adenomatous                            polyps on last colonoscopy > 5 years ago Medicines:                Propofol per Anesthesia, Monitored Anesthesia Care Procedure:                Pre-Anesthesia Assessment:                           - Prior to the procedure, a History and Physical                            was performed, and patient medications and                            allergies were reviewed. The patient's tolerance of                            previous anesthesia was also reviewed. The risks                            and benefits of the procedure and the sedation                            options and risks were discussed with the patient.                            All questions were answered, and informed consent                            was obtained. Prior Anticoagulants: The patient                            last took aspirin 1 day prior to the procedure. ASA                            Grade Assessment: III - A patient with severe                            systemic disease. After reviewing the risks and                            benefits, the patient was deemed in satisfactory                            condition to undergo the procedure.  After obtaining informed consent, the colonoscope                            was passed under direct vision. Throughout the                            procedure, the patient's blood pressure, pulse, and                            oxygen saturations were monitored continuously. The                            Model CF-HQ190L [id number]) scope was introduced   through the anus and advanced to the the cecum,                            identified by appendiceal orifice and ileocecal                            valve. The colonoscopy was performed without                            difficulty. The patient tolerated the procedure                            well. The quality of the bowel preparation was                            good. The bowel preparation used was Miralax. The                            ileocecal valve, appendiceal orifice, and rectum                            were photographed. Scope In: 11:21:10 AM Scope Out: 11:42:27 AM Scope Withdrawal Time: 0 hours 18 minutes 18 seconds  Total Procedure Duration: 0 hours 21 minutes 17 seconds  Findings:                 The perianal and digital rectal examinations were                            normal. Pertinent negatives include normal prostate                            (size, shape, and consistency).                           Many sessile polyps were found in the sigmoid                            colon, descending colon, transverse colon and                            cecum. The polyps were 3 to 8 mm  in size. These                            polyps were removed with a cold snare. Resection                            and retrieval were complete. Verification of                            patient identification for the specimen was done.                            Estimated blood loss was minimal.                           Diverticula were found in the sigmoid colon.                           The exam was otherwise without abnormality on                            direct and retroflexion views. Complications:            No immediate complications. Estimated Blood Loss:     Estimated blood loss was minimal. Impression:               - Many 3 to 8 mm polyps in the sigmoid colon, in                            the descending colon, in the transverse colon and                            in the  cecum, removed with a cold snare. Resected                            and retrieved.                           - Mild diverticulosis in the sigmoid colon.                           - The examination was otherwise normal on direct                            and retroflexion views.                           - Personal history of colonic polyps. Last                            colonoscopy 2008 Recommendation:           - Patient has a contact number available for                            emergencies. The signs and symptoms of  potential                            delayed complications were discussed with the                            patient. Return to normal activities tomorrow.                            Written discharge instructions were provided to the                            patient.                           - Resume previous diet.                           - Repeat colonoscopy is recommended for                            surveillance. The colonoscopy date will be                            determined after pathology results from today's                            exam become available for review.                           - Continue present medications. Gatha Mayer, MD 07/03/2016 11:56:05 AM This report has been signed electronically.

## 2016-07-03 NOTE — Patient Instructions (Addendum)
I removed 13 polyps today - all small so no worries but that is a lot so will look again in 1 year most likely. I will let you know pathology results and when to have another routine colonoscopy by mail.  I appreciate the opportunity to care for you. Gatha Mayer, MD, FACG   YOU HAD AN ENDOSCOPIC PROCEDURE TODAY AT [facility]:   Refer to the procedure report that was given to you for any specific questions about what was found during the examination.  If the procedure report does not answer your questions, please call your gastroenterologist to clarify.  If you requested that your care partner not be given the details of your procedure findings, then the procedure report has been included in a sealed envelope for you to review at your convenience later.  YOU SHOULD EXPECT: Some feelings of bloating in the abdomen. Passage of more gas than usual.  Walking can help get rid of the air that was put into your GI tract during the procedure and reduce the bloating. If you had a lower endoscopy (such as a colonoscopy or flexible sigmoidoscopy) you may notice spotting of blood in your stool or on the toilet paper. If you underwent a bowel prep for your procedure, you may not have a normal bowel movement for a few days.  Please Note:  You might notice some irritation and congestion in your nose or some drainage.  This is from the oxygen used during your procedure.  There is no need for concern and it should clear up in a day or so.  SYMPTOMS TO REPORT IMMEDIATELY:   Following lower endoscopy (colonoscopy or flexible sigmoidoscopy):  Excessive amounts of blood in the stool  Significant tenderness or worsening of abdominal pains  Swelling of the abdomen that is new, acute  Fever of 100F or higher   For urgent or emergent issues, a gastroenterologist can be reached at any hour by calling [phone].   DIET:  We do recommend a small meal at first, but then you may  proceed to your regular diet.  Drink plenty of fluids but you should avoid alcoholic beverages for 24 hours.  ACTIVITY:  You should plan to take it easy for the rest of today and you should NOT DRIVE or use heavy machinery until tomorrow (because of the sedation medicines used during the test).    FOLLOW UP: Our staff will call the number listed on your records the next business day following your procedure to check on you and address any questions or concerns that you may have regarding the information given to you following your procedure. If we do not reach you, we will leave a message.  However, if you are feeling well and you are not experiencing any problems, there is no need to return our call.  We will assume that you have returned to your regular daily activities without incident.  If any biopsies were taken you will be contacted by phone or by letter within the next 1-3 weeks.  Please call us at [phone] if you have not heard about the biopsies in 3 weeks.    SIGNATURES/CONFIDENTIALITY: You and/or your care partner have signed paperwork which will be entered into your electronic medical record.  These signatures attest to the fact that that the information above on your After Visit Summary has been reviewed and is understood.  Full responsibility of the confidentiality of this discharge information lies with you and/or  your care-partner.   Information on polyps and diverticulosis given to you today

## 2016-07-04 ENCOUNTER — Telehealth: Payer: Self-pay | Admitting: *Deleted

## 2016-07-04 NOTE — Telephone Encounter (Signed)
  Follow up Call-  Call back number 07/03/2016  Post procedure Call Back phone  # [phone]  Permission to leave phone message Yes  Some recent data might be hidden     Patient questions:  Do you have a fever, pain , or abdominal swelling? No. Pain Score  0 *  Have you tolerated food without any problems? Yes.    Have you been able to return to your normal activities? Yes.    Do you have any questions about your discharge instructions: Diet   No. Medications  No. Follow up visit  No.  Do you have questions or concerns about your Care? No.  Actions: * If pain score is 4 or above: No action needed, pain <4.

## 2016-07-09 ENCOUNTER — Encounter: Payer: Self-pay | Admitting: Internal Medicine

## 2016-07-09 NOTE — Progress Notes (Signed)
13 adenomas max 8 mm Recall 1 yr 06/2017

## 2016-08-25 ENCOUNTER — Other Ambulatory Visit: Payer: Self-pay | Admitting: Internal Medicine

## 2016-11-08 ENCOUNTER — Encounter: Payer: Self-pay | Admitting: Internal Medicine

## 2016-11-26 ENCOUNTER — Ambulatory Visit (INDEPENDENT_AMBULATORY_CARE_PROVIDER_SITE_OTHER): Payer: Managed Care, Other (non HMO) | Admitting: Internal Medicine

## 2016-11-26 ENCOUNTER — Other Ambulatory Visit (INDEPENDENT_AMBULATORY_CARE_PROVIDER_SITE_OTHER): Payer: Managed Care, Other (non HMO)

## 2016-11-26 ENCOUNTER — Encounter: Payer: Self-pay | Admitting: Internal Medicine

## 2016-11-26 VITALS — BP 138/90 | HR 73 | Wt 247.0 lb

## 2016-11-26 DIAGNOSIS — Z0000 Encounter for general adult medical examination without abnormal findings: Secondary | ICD-10-CM

## 2016-11-26 DIAGNOSIS — E119 Type 2 diabetes mellitus without complications: Secondary | ICD-10-CM

## 2016-11-26 DIAGNOSIS — I10 Essential (primary) hypertension: Secondary | ICD-10-CM

## 2016-11-26 DIAGNOSIS — E291 Testicular hypofunction: Secondary | ICD-10-CM

## 2016-11-26 LAB — TSH: TSH: 4.35 u[IU]/mL (ref 0.35–4.50)

## 2016-11-26 LAB — CBC WITH DIFFERENTIAL/PLATELET
BASOS ABS: 0 10*3/uL (ref 0.0–0.1)
Basophils Relative: 0.6 % (ref 0.0–3.0)
EOS ABS: 0.2 10*3/uL (ref 0.0–0.7)
Eosinophils Relative: 3.2 % (ref 0.0–5.0)
HEMATOCRIT: 39 % (ref 39.0–52.0)
Hemoglobin: 13.4 g/dL (ref 13.0–17.0)
LYMPHS PCT: 33.3 % (ref 12.0–46.0)
Lymphs Abs: 2.3 10*3/uL (ref 0.7–4.0)
MCHC: 34.4 g/dL (ref 30.0–36.0)
MCV: 96.4 fl (ref 78.0–100.0)
MONOS PCT: 10.2 % (ref 3.0–12.0)
Monocytes Absolute: 0.7 10*3/uL (ref 0.1–1.0)
NEUTROS ABS: 3.7 10*3/uL (ref 1.4–7.7)
NEUTROS PCT: 52.7 % (ref 43.0–77.0)
PLATELETS: 236 10*3/uL (ref 150.0–400.0)
RBC: 4.05 Mil/uL — AB (ref 4.22–5.81)
RDW: 12.7 % (ref 11.5–15.5)
WBC: 7 10*3/uL (ref 4.0–10.5)

## 2016-11-26 LAB — LIPID PANEL
Cholesterol: 205 mg/dL — ABNORMAL HIGH (ref 0–200)
HDL: 106.9 mg/dL (ref >39.00)
LDL Cholesterol: 89 mg/dL (ref 0–99)
NONHDL: 98.05
Total CHOL/HDL Ratio: 2
Triglycerides: 45 mg/dL (ref 0.0–149.0)
VLDL: 9 mg/dL (ref 0.0–40.0)

## 2016-11-26 LAB — TESTOSTERONE: Testosterone: 161.02 ng/dL — ABNORMAL LOW (ref 300.00–890.00)

## 2016-11-26 LAB — BASIC METABOLIC PANEL
BUN: 22 mg/dL (ref 6–23)
CALCIUM: 9.3 mg/dL (ref 8.4–10.5)
CO2: 29 meq/L (ref 19–32)
CREATININE: 1.17 mg/dL (ref 0.40–1.50)
Chloride: 103 mEq/L (ref 96–112)
GFR: 66.32 mL/min (ref >60.00)
Glucose, Bld: 93 mg/dL (ref 70–99)
Potassium: 4.4 mEq/L (ref 3.5–5.1)
SODIUM: 137 meq/L (ref 135–145)

## 2016-11-26 LAB — HEPATIC FUNCTION PANEL
ALK PHOS: 34 U/L — AB (ref 39–117)
ALT: 16 U/L (ref 0–53)
AST: 21 U/L (ref 0–37)
Albumin: 4.1 g/dL (ref 3.5–5.2)
BILIRUBIN DIRECT: 0.1 mg/dL (ref 0.0–0.3)
BILIRUBIN TOTAL: 0.5 mg/dL (ref 0.2–1.2)
Total Protein: 7.4 g/dL (ref 6.0–8.3)

## 2016-11-26 LAB — PSA: PSA: 0.49 ng/mL (ref 0.10–4.00)

## 2016-11-26 LAB — HEMOGLOBIN A1C: Hgb A1c MFr Bld: 5.5 % (ref 4.6–6.5)

## 2016-11-26 LAB — HEPATITIS C ANTIBODY: HCV Ab: NEGATIVE

## 2016-11-26 MED ORDER — CLOMIPHENE CITRATE 50 MG PO TABS: 25.0000 mg | ORAL_TABLET | Freq: Every day | ORAL | 1 refills | Status: DC

## 2016-11-26 NOTE — Assessment & Plan Note (Signed)
  On diet  

## 2016-11-26 NOTE — Progress Notes (Signed)
Pre visit review using our clinic review tool, if applicable. No additional management support is needed unless otherwise documented below in the visit note. 

## 2016-11-26 NOTE — Assessment & Plan Note (Signed)
F/u w/Dr Sable Feil On Clomid

## 2016-11-26 NOTE — Assessment & Plan Note (Signed)
Labs

## 2016-11-26 NOTE — Assessment & Plan Note (Signed)
Indapamide 

## 2016-11-26 NOTE — Progress Notes (Signed)
Subjective:  Patient ID: Ronnie Curtis, male    DOB: February 02, 1951  Age: 66 y.o. MRN: [id number]  CC: No chief complaint on file.   HPI ADREIN WILLIG presents for a f/u OSA, GERD, obesity He quit [facility] Mee Hives in [facility]   Outpatient Medications Prior to Visit  Medication Sig Dispense Refill  . aspirin 81 MG EC tablet Take 81 mg by mouth every evening.     . Cholecalciferol 1000 UNITS tablet Take 2,000 Units by mouth daily.     . clomiPHENE (CLOMID) 50 MG tablet Take one-half tablet by  mouth daily 45 tablet 1  . Cyanocobalamin (VITAMIN B-12) 500 MCG SUBL 1 sl qd 100 tablet 3  . fluticasone (FLONASE) 50 MCG/ACT nasal spray USE 2 SPRAYS IN EACH  NOSTRIL DAILY 48 g 2  . indapamide (LOZOL) 2.5 MG tablet TAKE 1 TABLET BY MOUTH  DAILY 90 tablet 2  . omeprazole (PRILOSEC) 40 MG capsule Take 1 capsule (40 mg total) by mouth daily. 90 capsule 3  . oxyCODONE (ROXICODONE) 15 MG immediate release tablet 1 tablet every 6 (six) hours as needed.   0  . naproxen (NAPROSYN) 500 MG tablet Take 1 tablet (500 mg total) by mouth 2 (two) times daily. (Patient not taking: Reported on 11/26/2016) 30 tablet 0   Facility-Administered Medications Prior to Visit  Medication Dose Route Frequency Provider Last Rate Last Dose  . 0.9 %  sodium chloride infusion  500 mL Intravenous Continuous Gatha Mayer, MD        ROS Review of Systems  Constitutional: Positive for unexpected weight change. Negative for appetite change and fatigue.  HENT: Positive for sinus pain and sinus pressure. Negative for congestion, nosebleeds, sneezing, sore throat, trouble swallowing and voice change.   Eyes: Negative for itching and visual disturbance.  Respiratory: Negative for cough.   Cardiovascular: Negative for chest pain, palpitations and leg swelling.  Gastrointestinal: Negative for abdominal distention, blood in stool, diarrhea and nausea.  Genitourinary: Negative for frequency and hematuria.    Musculoskeletal: Negative for back pain, gait problem, joint swelling and neck pain.  Skin: Negative for rash.  Neurological: Negative for dizziness, tremors, speech difficulty and weakness.  Psychiatric/Behavioral: Negative for agitation, dysphoric mood, sleep disturbance and suicidal ideas. The patient is not nervous/anxious.     Objective:  BP (!) 150/80   Pulse 73   Wt 247 lb (112 kg)   SpO2 97%   BMI 39.87 kg/m   BP Readings from Last 3 Encounters:  11/26/16 (!) 150/80  07/03/16 (!) 146/72  04/12/16 140/68    Wt Readings from Last 3 Encounters:  11/26/16 247 lb (112 kg)  07/03/16 238 lb (108 kg)  06/19/16 238 lb 12.8 oz (108.3 kg)    Physical Exam  Constitutional: He is oriented to person, place, and time. He appears well-developed. No distress.  NAD  HENT:  Mouth/Throat: Oropharynx is clear and moist.  Eyes: Conjunctivae are normal. Pupils are equal, round, and reactive to light.  Neck: Normal range of motion. No JVD present. No thyromegaly present.  Cardiovascular: Normal rate, regular rhythm, normal heart sounds and intact distal pulses.  Exam reveals no gallop and no friction rub.   No murmur heard. Pulmonary/Chest: Effort normal and breath sounds normal. No respiratory distress. He has no wheezes. He has no rales. He exhibits no tenderness.  Abdominal: Soft. Bowel sounds are normal. He exhibits no distension and no mass. There is no tenderness. There  is no rebound and no guarding.  Musculoskeletal: Normal range of motion. He exhibits no edema or tenderness.  Lymphadenopathy:    He has no cervical adenopathy.  Neurological: He is alert and oriented to person, place, and time. He has normal reflexes. No cranial nerve deficit. He exhibits normal muscle tone. He displays a negative Romberg sign. Coordination and gait normal.  Skin: Skin is warm and dry. No rash noted.  Psychiatric: He has a normal mood and affect. His behavior is normal. Judgment and thought content  normal.  Obese  Lab Results  Component Value Date   WBC 4.6 04/02/2016   HGB 12.8 (L) 04/02/2016   HCT 37.2 (L) 04/02/2016   PLT 204.0 04/02/2016   GLUCOSE 104 (H) 04/02/2016   CHOL 181 04/02/2016   TRIG 56.0 04/02/2016   HDL 76.40 04/02/2016   LDLDIRECT 70.7 01/19/2009   LDLCALC 94 04/02/2016   ALT 43 04/02/2016   AST 43 (H) 04/02/2016   NA 138 04/02/2016   K 4.4 04/02/2016   CL 101 04/02/2016   CREATININE 0.79 04/02/2016   BUN 15 04/02/2016   CO2 30 04/02/2016   TSH 2.35 04/02/2016   PSA 0.41 04/02/2016   HGBA1C 5.5 04/02/2016    No results found.  Assessment & Plan:   There are no diagnoses linked to this encounter. I am having Mr. Hardnett maintain his aspirin, Cholecalciferol, naproxen, oxyCODONE, Vitamin B-12, omeprazole, clomiPHENE, fluticasone, indapamide, and brimonidine. We will continue to administer sodium chloride.  Meds ordered this encounter  Medications  . brimonidine (ALPHAGAN) 0.2 % ophthalmic solution    Sig: Apply 1 drop to eye 2 (two) times daily.     Follow-up: No Follow-up on file.  Walker Kehr, MD

## 2016-11-27 ENCOUNTER — Encounter: Payer: Self-pay | Admitting: Internal Medicine

## 2016-11-29 DIAGNOSIS — Z0289 Encounter for other administrative examinations: Secondary | ICD-10-CM

## 2016-12-06 ENCOUNTER — Encounter: Payer: Self-pay | Admitting: Endocrinology

## 2016-12-06 ENCOUNTER — Ambulatory Visit (INDEPENDENT_AMBULATORY_CARE_PROVIDER_SITE_OTHER): Payer: Managed Care, Other (non HMO) | Admitting: Endocrinology

## 2016-12-06 ENCOUNTER — Telehealth: Payer: Self-pay | Admitting: Internal Medicine

## 2016-12-06 VITALS — BP 144/84 | HR 77 | Ht 69.0 in | Wt 249.0 lb

## 2016-12-06 DIAGNOSIS — E291 Testicular hypofunction: Secondary | ICD-10-CM

## 2016-12-06 DIAGNOSIS — N529 Male erectile dysfunction, unspecified: Secondary | ICD-10-CM | POA: Diagnosis not present

## 2016-12-06 NOTE — Progress Notes (Addendum)
Subjective:    Patient ID: Ronnie Curtis, male    DOB: January 28, 1951, 66 y.o.   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  Pt reports he had puberty at age 7.  He has 3 biological children.  He says he has never taken parenteral androgens, but he has taken testosterone "booster" product in the past.  I last saw him in Feb 27, 2012, and rx'ed clomid.  He has taken this until aprox 1 week ago.  He takes chronic opioids.  He denies any h/o infertility, XRT, or genital infection.  He has never had surgery, or a serious injury to the head or genital area. He has no h/o or DVT, but he has sleep apnea.   He consumes alcohol, 8 beers, 3 times a week.   He has slight swelling of the breasts, and assoc ED sxs.  Past Medical History:  Diagnosis Date  . Allergy    grass, mold, dust  . Anxiety    Panic disorder  . Arthritis    "back" (04/13/2014)  . Asthmatic bronchitis   . Chronic lower back pain   . Depression ~ 02-27-07   "when granddaughter died"   . GERD (gastroesophageal reflux disease)   . Heart murmur    hx of  . Hemorrhoids   . HTN (hypertension)   . Hypogonadism male   . Multiple polyps of ethmoid sinus   . Obesity   . OSA (obstructive sleep apnea)    PSG 05/24/10 AHI 90, CPAP 13cm H2O- hasn't used CPAP in years Feb 26, 2014)  . Pneumonia    "couple times" (04/13/2014)  . Retinal detachment    "multiple"  . Tubular adenoma of colon    2 in February 27, 2004 and 3 in 02/27/07    Past Surgical History:  Procedure Laterality Date  . CARPAL TUNNEL RELEASE Left   . CATARACT EXTRACTION W/ INTRAOCULAR LENS  IMPLANT, BILATERAL  2003/02/27  . COLONOSCOPY W/ BIOPSIES     multiple  . LASER PHOTO ABLATION Left 04/13/2014   Procedure: LASER PHOTO ABLATION;  Surgeon: Hayden Pedro, MD;  Location: [place];  Service: Ophthalmology;  Laterality: Left;  . NASAL RECONSTRUCTION WITH SEPTAL REPAIR  1970's X 2  . NASAL SEPTUM SURGERY  05/14/2005   bilateral polypectomy, bilateral total  ethmoidectomy, left frontal sinusotomy  . PARS PLANA VITRECTOMY Left 04/13/2014   Procedure: PARS PLANA VITRECTOMY WITH 25 GAUGE;  Surgeon: Hayden Pedro, MD;  Location: [place];  Service: Ophthalmology;  Laterality: Left;  . REPAIR OF COMPLEX TRACTION RETINAL DETACHMENT Left 04/13/2014   Procedure: REPAIR OF COMPLEX TRACTION RETINAL DETACHMENT;  Surgeon: Hayden Pedro, MD;  Location: [place];  Service: Ophthalmology;  Laterality: Left;  . RETINAL DETACHMENT SURGERY Left    "4 on the left now; 2 on the right" (04/13/2014)  . TONSILLECTOMY AND ADENOIDECTOMY    . URETHROTOMY     x3  . VASECTOMY      Social History   Social History  . Marital status: Married    Spouse name: N/A  . Number of children: 3  . Years of education: N/A   Occupational History  . [employer] manager    Social History Main Topics  . Smoking status: Never Smoker  . Smokeless tobacco: Never Used  . Alcohol use 1.2 oz/week    2 Cans of beer per week  . Drug use: No  . Sexual activity: Yes   Other Topics Concern  .  Not on file   Social History Narrative   Occasional THC use - denies   Regular Exercise -  NO          Current Outpatient Prescriptions on File Prior to Visit  Medication Sig Dispense Refill  . aspirin 81 MG EC tablet Take 81 mg by mouth every evening.     . brimonidine (ALPHAGAN) 0.2 % ophthalmic solution Apply 1 drop to eye 2 (two) times daily.    . Cholecalciferol 1000 UNITS tablet Take 2,000 Units by mouth daily.     . Cyanocobalamin (VITAMIN B-12) 500 MCG SUBL 1 sl qd 100 tablet 3  . fluticasone (FLONASE) 50 MCG/ACT nasal spray USE 2 SPRAYS IN EACH  NOSTRIL DAILY 48 g 2  . indapamide (LOZOL) 2.5 MG tablet TAKE 1 TABLET BY MOUTH  DAILY 90 tablet 2  . naproxen (NAPROSYN) 500 MG tablet Take 1 tablet (500 mg total) by mouth 2 (two) times daily. 30 tablet 0  . omeprazole (PRILOSEC) 40 MG capsule Take 1 capsule (40 mg total) by mouth daily. 90 capsule 3  . oxyCODONE (ROXICODONE) 15 MG immediate  release tablet 1 tablet every 6 (six) hours as needed.   0  . clomiPHENE (CLOMID) 50 MG tablet Take 0.5 tablets (25 mg total) by mouth daily. (Patient not taking: Reported on 12/06/2016) 45 tablet 1   Current Facility-Administered Medications on File Prior to Visit  Medication Dose Route Frequency Provider Last Rate Last Dose  . 0.9 %  sodium chloride infusion  500 mL Intravenous Continuous Gatha Mayer, MD        Allergies  Allergen Reactions  . Moviprep [Peg-Kcl-Nacl-Nasulf-Na Asc-C] Nausea And Vomiting  . Cialis [Tadalafil]     HAs  . Erythromycin Other (See Comments)    Sever reflux  . Maxzide [Triamterene-Hctz]     cramps  . Spironolactone     gynecomastia  . Triamterene-Hydrochlorothiazide  [Hydrochlorothiazide W-Triamterene]     Other reaction(s): Unknown cramps    Family History  Problem Relation Age of Onset  . Allergies Mother   . Lymphoma Mother   . Cancer Mother     lymphoma  . Heart disease Father     CAD, CHF  . Hypertension Other   . Hypertension Brother   . Arthritis Brother     OA  . Colon cancer Neg Hx   . Other Neg Hx     low testosterone    BP (!) 144/84   Pulse 77   Ht 5\' 9"  (1.753 m)   Wt 249 lb (112.9 kg)   SpO2 95%   BMI 36.77 kg/m    Review of Systems denies depression, numbness, weight change, decreased urinary stream, galactorrhea, muscle weakness, fever, headache, easy bruising, sob, rash, blurry vision, rhinorrhea, chest pain.  He has a slightly slow urinary stream.     Objective:   Physical Exam VS: see vs page GEN: no distress HEAD: head: no deformity eyes: no periorbital swelling, no proptosis external nose and ears are normal mouth: no lesion seen NECK: supple, thyroid is not enlarged CHEST WALL: no deformity LUNGS: clear to auscultation BREASTS:  Moderate bilat pseudogynecomastia CV: reg rate and rhythm, no murmur ABD: abdomen is soft, nontender.  no hepatosplenomegaly.  not distended.  no hernia GENITALIA:  Normal  male.   MUSCULOSKELETAL: muscle bulk and strength are grossly normal.  no obvious joint swelling.  gait is normal and steady EXTEMITIES: 1+ bilat leg edema PULSES: no carotid bruit NEURO:  cn  2-12 grossly intact.   readily moves all 4's.  sensation is intact to touch on all 4's SKIN:  Normal texture and temperature.  No rash or suspicious lesion is visible.   NODES:  None palpable at the neck.  PSYCH: alert, well-oriented.  Does not appear anxious nor depressed.     Lab Results  Component Value Date   PSA 0.49 11/26/2016   PSA 0.41 04/02/2016   PSA 0.30 04/22/2015   Lab Results  Component Value Date   WBC 7.0 11/26/2016   HGB 13.4 11/26/2016   HCT 39.0 11/26/2016   MCV 96.4 11/26/2016   PLT 236.0 11/26/2016   Lab Results  Component Value Date   TESTOSTERONE 396 12/06/2016   I have reviewed outside records, and summarized:  Pt was noted to have hypogonadism, and referred here.  He was noted to have a recent slightly low testosterone, and there was a question of the efficacy of the clomid.       Assessment & Plan:  Hypogonadism: well-controlled ED sxs, new to me: prob exac by alcoholism and opiods Please continue the same clomid.   Ref urol.

## 2016-12-06 NOTE — Patient Instructions (Signed)
blood tests are requested for you today.  We'll let you know about the results. If it is good, you should see a urologist about the ED symptoms If it is low, we can double the clomiphene.  Please return in 1 year.

## 2016-12-06 NOTE — Telephone Encounter (Signed)
Pt came by office at 3:43 pm thinking Dr. Loanne Drilling was in this bldg & stated he had a 4 pm appt with with him.  I called Dr. Cordelia Pen office to make sure pt could still be seen today since he came to the wrong office.  Pt was informed to head over there per [facility].  Pt stated he needed to add a document from his opthalmalogist to the already received [employer] forms we were in possession of.  I went to the cabinet to see if his forms were ready for pick up and they were.  I tried to give the completed forms to the pt so he could add the other document to them, but he was insistent on [place] mailing them for him.  He only wanted to take the order for a urine with him that was in the envelope.  Pt left without taking all of the forms.  When I went through the forms to find the appropriate address to send them to, there was no address or fax number on any of the forms for [place] to send the completed forms to.  I notified Erline Levine of all of this.  Erline Levine will keep the documents and reach out to pt.

## 2016-12-07 ENCOUNTER — Encounter: Payer: Self-pay | Admitting: Internal Medicine

## 2016-12-07 NOTE — Telephone Encounter (Signed)
I called pt- I advised him there is not fax number or address for me to send papers to. He is going to see if he get get this info and call me back. Papers are on my desk in an envelope with his name on it.

## 2016-12-07 NOTE — Telephone Encounter (Signed)
Pt emailed back with DMV address. I mailed originals to address he provided for [facility]. Copy sent to be scanned into chart.

## 2016-12-08 LAB — PROLACTIN: Prolactin: 5.6 ng/mL (ref 4.0–15.2)

## 2016-12-08 LAB — TESTOSTERONE,FREE AND TOTAL
TESTOSTERONE FREE: 5.2 pg/mL — AB (ref 6.6–18.1)
Testosterone: 396 ng/dL (ref 264–916)

## 2016-12-14 ENCOUNTER — Encounter: Payer: Self-pay | Admitting: Endocrinology

## 2016-12-14 ENCOUNTER — Other Ambulatory Visit: Payer: Self-pay

## 2016-12-14 MED ORDER — CLOMIPHENE CITRATE 50 MG PO TABS: 25.0000 mg | ORAL_TABLET | Freq: Every day | ORAL | 1 refills | Status: DC

## 2017-03-27 ENCOUNTER — Ambulatory Visit: Payer: Managed Care, Other (non HMO) | Admitting: Internal Medicine

## 2017-05-05 ENCOUNTER — Other Ambulatory Visit: Payer: Self-pay | Admitting: Internal Medicine

## 2017-06-27 ENCOUNTER — Telehealth: Payer: Managed Care, Other (non HMO) | Admitting: Physician Assistant

## 2017-06-27 DIAGNOSIS — N390 Urinary tract infection, site not specified: Secondary | ICD-10-CM

## 2017-06-27 NOTE — Progress Notes (Signed)
We are sorry that you are not feeling well.  Here is how we plan to help!  Male bladder infections are not very common.  We worry about prostate or kidney conditions.  The standard of care is to examine the abdomen and kidneys, and to do a urine and blood test to make sure that something more serious is not going on.  We recommend that you see a provider today.  If your doctor's office is closed [facility] has the following Urgent Cares:  If you need care fast and have a high deductible or no insurance consider:   [email or web address]  [phone]  [address], [address] [place], [place] [address] 8 am to 8 pm Monday-Friday 10 am to 4 pm Saturday-Sunday   The following sites will take your  insurance:    . [facility]  [phone] Get Driving Directions Find a Provider at this Location  [address] [place], [place] [address] . 10 am to 8 pm Monday-Friday . 12 pm to 8 pm Saturday-Sunday   . [facility]  [phone] Get Driving Directions Find a Provider at this Location  [address], [address] , [place] [address] . 8 am to 8 pm Monday-Friday . 9 am to 6 pm Saturday . 11 am to 6 pm Sunday   . [facility] [place]  [phone] Get Driving Directions  [address] [place], [place] [address] . 8 am to 8 pm Monday-Friday . 8 am to 4 pm Saturday-Sunday   . [facility] (a walk in primary care provider)  [phone]  Get Driving Directions Find a Provider at this Location  [address] [place], [place] [address] . 8 am to 8:30 pm Monday-Thursday . 8 am to 6 pm Friday . 8 am to 4 pm Saturday-Sunday   Your e-visit answers were reviewed by a board certified advanced clinical practitioner to complete your personal care plan.  Thank you for using e-Visits.   

## 2017-07-30 ENCOUNTER — Encounter: Payer: Self-pay | Admitting: Internal Medicine

## 2018-02-04 ENCOUNTER — Other Ambulatory Visit: Payer: Self-pay | Admitting: Internal Medicine

## 2018-12-08 DIAGNOSIS — F1120 Opioid dependence, uncomplicated: Secondary | ICD-10-CM | POA: Diagnosis not present

## 2018-12-08 DIAGNOSIS — G8929 Other chronic pain: Secondary | ICD-10-CM | POA: Diagnosis not present

## 2018-12-08 DIAGNOSIS — M545 Low back pain: Secondary | ICD-10-CM | POA: Diagnosis not present

## 2018-12-08 DIAGNOSIS — Z79899 Other long term (current) drug therapy: Secondary | ICD-10-CM | POA: Diagnosis not present

## 2019-01-08 DIAGNOSIS — M545 Low back pain: Secondary | ICD-10-CM | POA: Diagnosis not present

## 2019-01-08 DIAGNOSIS — G8929 Other chronic pain: Secondary | ICD-10-CM | POA: Diagnosis not present

## 2019-01-08 DIAGNOSIS — Z79899 Other long term (current) drug therapy: Secondary | ICD-10-CM | POA: Diagnosis not present

## 2019-01-08 DIAGNOSIS — Z79891 Long term (current) use of opiate analgesic: Secondary | ICD-10-CM | POA: Diagnosis not present

## 2019-02-05 DIAGNOSIS — G8929 Other chronic pain: Secondary | ICD-10-CM | POA: Diagnosis not present

## 2019-02-05 DIAGNOSIS — M545 Low back pain: Secondary | ICD-10-CM | POA: Diagnosis not present

## 2019-02-05 DIAGNOSIS — M5416 Radiculopathy, lumbar region: Secondary | ICD-10-CM | POA: Diagnosis not present

## 2019-02-05 DIAGNOSIS — Z79899 Other long term (current) drug therapy: Secondary | ICD-10-CM | POA: Diagnosis not present

## 2019-02-05 DIAGNOSIS — Z79891 Long term (current) use of opiate analgesic: Secondary | ICD-10-CM | POA: Diagnosis not present

## 2019-03-06 DIAGNOSIS — F1120 Opioid dependence, uncomplicated: Secondary | ICD-10-CM | POA: Diagnosis not present

## 2019-03-06 DIAGNOSIS — Z79899 Other long term (current) drug therapy: Secondary | ICD-10-CM | POA: Diagnosis not present

## 2019-03-06 DIAGNOSIS — M5416 Radiculopathy, lumbar region: Secondary | ICD-10-CM | POA: Diagnosis not present

## 2019-03-06 DIAGNOSIS — M545 Low back pain: Secondary | ICD-10-CM | POA: Diagnosis not present

## 2019-03-06 DIAGNOSIS — G8929 Other chronic pain: Secondary | ICD-10-CM | POA: Diagnosis not present

## 2019-04-03 DIAGNOSIS — M545 Low back pain: Secondary | ICD-10-CM | POA: Diagnosis not present

## 2019-04-03 DIAGNOSIS — G8929 Other chronic pain: Secondary | ICD-10-CM | POA: Diagnosis not present

## 2019-04-03 DIAGNOSIS — Z79891 Long term (current) use of opiate analgesic: Secondary | ICD-10-CM | POA: Diagnosis not present

## 2019-04-03 DIAGNOSIS — M5416 Radiculopathy, lumbar region: Secondary | ICD-10-CM | POA: Diagnosis not present

## 2019-04-03 DIAGNOSIS — Z79899 Other long term (current) drug therapy: Secondary | ICD-10-CM | POA: Diagnosis not present

## 2019-05-05 DIAGNOSIS — G8929 Other chronic pain: Secondary | ICD-10-CM | POA: Diagnosis not present

## 2019-05-05 DIAGNOSIS — M545 Low back pain: Secondary | ICD-10-CM | POA: Diagnosis not present

## 2019-05-05 DIAGNOSIS — Z79899 Other long term (current) drug therapy: Secondary | ICD-10-CM | POA: Diagnosis not present

## 2019-05-05 DIAGNOSIS — M5416 Radiculopathy, lumbar region: Secondary | ICD-10-CM | POA: Diagnosis not present

## 2019-05-05 DIAGNOSIS — Z79891 Long term (current) use of opiate analgesic: Secondary | ICD-10-CM | POA: Diagnosis not present

## 2020-08-11 ENCOUNTER — Other Ambulatory Visit: Payer: Self-pay | Admitting: Urology

## 2020-08-31 ENCOUNTER — Other Ambulatory Visit: Payer: Self-pay

## 2020-08-31 ENCOUNTER — Encounter (HOSPITAL_BASED_OUTPATIENT_CLINIC_OR_DEPARTMENT_OTHER): Payer: Self-pay | Admitting: Urology

## 2020-08-31 NOTE — Progress Notes (Signed)
Spoke w/ via phone for pre-op interview---pt Lab needs dos----  I stat 8 (gent)             Lab results------none COVID test ------09-05-2020 815 Arrive at -------815 NPO after MN NO Solid Food.  Clear liquids from MN until---715 am then npo Medications to take morning of surgery -----oemprazole, tamsulosin, eye drop Diabetic medication -----n/a Patient Special Instructions -----none Pre-Op special Istructions -----none Patient verbalized understanding of instructions that were given at this phone interview. Patient denies shortness of breath, chest pain, fever, cough at this phone interview.

## 2020-09-05 ENCOUNTER — Other Ambulatory Visit (HOSPITAL_COMMUNITY)
Admission: RE | Admit: 2020-09-05 | Discharge: 2020-09-05 | Disposition: A | Discharge status: Home / Self Care | Payer: Medicare HMO | Source: Ambulatory Visit | Attending: Urology | Admitting: Urology

## 2020-09-05 DIAGNOSIS — Z20822 Contact with and (suspected) exposure to covid-19: Secondary | ICD-10-CM | POA: Insufficient documentation

## 2020-09-05 DIAGNOSIS — Z01812 Encounter for preprocedural laboratory examination: Secondary | ICD-10-CM | POA: Insufficient documentation

## 2020-09-05 LAB — SARS CORONAVIRUS 2 (TAT 6-24 HRS): SARS Coronavirus 2: NEGATIVE

## 2020-09-07 ENCOUNTER — Ambulatory Visit (HOSPITAL_BASED_OUTPATIENT_CLINIC_OR_DEPARTMENT_OTHER)
Admission: RE | Admit: 2020-09-07 | Discharge: 2020-09-07 | Disposition: A | Discharge status: Home / Self Care | Payer: No Typology Code available for payment source | Attending: Urology | Admitting: Urology

## 2020-09-07 ENCOUNTER — Encounter (HOSPITAL_BASED_OUTPATIENT_CLINIC_OR_DEPARTMENT_OTHER)
Admission: RE | Disposition: A | Discharge status: Home / Self Care | Payer: Self-pay | Source: Home / Self Care | Attending: Urology

## 2020-09-07 ENCOUNTER — Encounter (HOSPITAL_BASED_OUTPATIENT_CLINIC_OR_DEPARTMENT_OTHER): Payer: Self-pay | Admitting: Urology

## 2020-09-07 ENCOUNTER — Ambulatory Visit (HOSPITAL_BASED_OUTPATIENT_CLINIC_OR_DEPARTMENT_OTHER): Payer: No Typology Code available for payment source | Admitting: Anesthesiology

## 2020-09-07 DIAGNOSIS — E669 Obesity, unspecified: Secondary | ICD-10-CM | POA: Diagnosis not present

## 2020-09-07 DIAGNOSIS — N1330 Unspecified hydronephrosis: Secondary | ICD-10-CM | POA: Diagnosis present

## 2020-09-07 DIAGNOSIS — N35916 Unspecified urethral stricture, male, overlapping sites: Secondary | ICD-10-CM | POA: Insufficient documentation

## 2020-09-07 DIAGNOSIS — Z6839 Body mass index (BMI) 39.0-39.9, adult: Secondary | ICD-10-CM | POA: Diagnosis not present

## 2020-09-07 DIAGNOSIS — N138 Other obstructive and reflux uropathy: Secondary | ICD-10-CM | POA: Insufficient documentation

## 2020-09-07 HISTORY — PX: CYSTOSCOPY/RETROGRADE/URETEROSCOPY: SHX5316

## 2020-09-07 HISTORY — DX: Unspecified hydronephrosis: N13.30

## 2020-09-07 HISTORY — DX: Prediabetes: R73.03

## 2020-09-07 HISTORY — DX: Personal history of other diseases of the circulatory system: Z86.79

## 2020-09-07 LAB — POCT I-STAT, CHEM 8
BUN: 16 mg/dL (ref 8–23)
Calcium, Ion: 1.28 mmol/L (ref 1.15–1.40)
Chloride: 99 mmol/L (ref 98–111)
Creatinine, Ser: 0.8 mg/dL (ref 0.61–1.24)
Glucose, Bld: 109 mg/dL — ABNORMAL HIGH (ref 70–99)
HCT: 40 % (ref 39.0–52.0)
Hemoglobin: 13.6 g/dL (ref 13.0–17.0)
Potassium: 4 mmol/L (ref 3.5–5.1)
Sodium: 140 mmol/L (ref 135–145)
TCO2: 28 mmol/L (ref 22–32)

## 2020-09-07 SURGERY — CYSTOSCOPY/RETROGRADE/URETEROSCOPY
Anesthesia: General | Laterality: Bilateral

## 2020-09-07 MED ORDER — CEPHALEXIN 500 MG PO CAPS: 500.0000 mg | ORAL_CAPSULE | Freq: Two times a day (BID) | ORAL | 0 refills | Status: AC

## 2020-09-07 MED ORDER — KETOROLAC TROMETHAMINE 30 MG/ML IJ SOLN
INTRAMUSCULAR | Status: DC | PRN
  Administered 2020-09-07: 15 mg via INTRAVENOUS

## 2020-09-07 MED ORDER — ONDANSETRON HCL 4 MG/2ML IJ SOLN
INTRAMUSCULAR | Status: AC
  Filled 2020-09-07: qty 2

## 2020-09-07 MED ORDER — LACTATED RINGERS IV SOLN: INTRAVENOUS | Status: DC

## 2020-09-07 MED ORDER — OXYCODONE HCL 5 MG/5ML PO SOLN: 5.0000 mg | Freq: Once | ORAL | Status: DC | PRN

## 2020-09-07 MED ORDER — MIDAZOLAM HCL 2 MG/2ML IJ SOLN
INTRAMUSCULAR | Status: AC
  Filled 2020-09-07: qty 2

## 2020-09-07 MED ORDER — LIDOCAINE 2% (20 MG/ML) 5 ML SYRINGE
INTRAMUSCULAR | Status: DC | PRN
  Administered 2020-09-07: 100 mg via INTRAVENOUS

## 2020-09-07 MED ORDER — PROPOFOL 10 MG/ML IV BOLUS
INTRAVENOUS | Status: AC
  Filled 2020-09-07: qty 20

## 2020-09-07 MED ORDER — PROMETHAZINE HCL 25 MG/ML IJ SOLN: 6.2500 mg | INTRAMUSCULAR | Status: DC | PRN

## 2020-09-07 MED ORDER — LIDOCAINE 2% (20 MG/ML) 5 ML SYRINGE
INTRAMUSCULAR | Status: AC
  Filled 2020-09-07: qty 5

## 2020-09-07 MED ORDER — FENTANYL CITRATE (PF) 100 MCG/2ML IJ SOLN
INTRAMUSCULAR | Status: AC
  Filled 2020-09-07: qty 2

## 2020-09-07 MED ORDER — MIDAZOLAM HCL 2 MG/2ML IJ SOLN
INTRAMUSCULAR | Status: DC | PRN
  Administered 2020-09-07: 2 mg via INTRAVENOUS

## 2020-09-07 MED ORDER — ONDANSETRON HCL 4 MG/2ML IJ SOLN
INTRAMUSCULAR | Status: DC | PRN
  Administered 2020-09-07: 4 mg via INTRAVENOUS

## 2020-09-07 MED ORDER — IOHEXOL 300 MG/ML  SOLN
INTRAMUSCULAR | Status: DC | PRN
  Administered 2020-09-07: 35 mL via URETHRAL

## 2020-09-07 MED ORDER — FENTANYL CITRATE (PF) 100 MCG/2ML IJ SOLN: 25.0000 ug | INTRAMUSCULAR | Status: DC | PRN

## 2020-09-07 MED ORDER — PROPOFOL 500 MG/50ML IV EMUL
INTRAVENOUS | Status: DC | PRN
  Administered 2020-09-07: 200 mg via INTRAVENOUS

## 2020-09-07 MED ORDER — KETOROLAC TROMETHAMINE 30 MG/ML IJ SOLN
INTRAMUSCULAR | Status: AC
  Filled 2020-09-07: qty 1

## 2020-09-07 MED ORDER — FENTANYL CITRATE (PF) 100 MCG/2ML IJ SOLN
INTRAMUSCULAR | Status: DC | PRN
  Administered 2020-09-07 (×2): 50 ug via INTRAVENOUS

## 2020-09-07 MED ORDER — SENNOSIDES-DOCUSATE SODIUM 8.6-50 MG PO TABS: 1.0000 | ORAL_TABLET | Freq: Every day | ORAL | 0 refills | Status: AC

## 2020-09-07 MED ORDER — TRAMADOL HCL 50 MG PO TABS
50.0000 mg | ORAL_TABLET | Freq: Four times a day (QID) | ORAL | 0 refills | Status: AC | PRN
Start: 2020-09-07 — End: 2021-09-07

## 2020-09-07 MED ORDER — GENTAMICIN SULFATE 40 MG/ML IJ SOLN
5.0000 mg/kg | INTRAVENOUS | Status: AC
  Administered 2020-09-07: 410 mg via INTRAVENOUS
  Filled 2020-09-07: qty 10.25

## 2020-09-07 MED ORDER — DEXAMETHASONE SODIUM PHOSPHATE 10 MG/ML IJ SOLN
INTRAMUSCULAR | Status: DC | PRN
  Administered 2020-09-07: 10 mg via INTRAVENOUS

## 2020-09-07 MED ORDER — OXYCODONE HCL 5 MG PO TABS: 5.0000 mg | ORAL_TABLET | Freq: Once | ORAL | Status: DC | PRN

## 2020-09-07 MED ORDER — DEXAMETHASONE SODIUM PHOSPHATE 10 MG/ML IJ SOLN
INTRAMUSCULAR | Status: AC
  Filled 2020-09-07: qty 1

## 2020-09-07 SURGICAL SUPPLY — 29 items
BAG DRAIN URO-CYSTO SKYTR STRL (DRAIN) ×3 IMPLANT
BAG DRN RND TRDRP ANRFLXCHMBR (UROLOGICAL SUPPLIES) ×1
BAG DRN UROCATH (DRAIN) ×1
BAG URINE DRAIN 2000ML AR STRL (UROLOGICAL SUPPLIES) ×2 IMPLANT
BALLN NEPHROSTOMY (BALLOONS) ×3
BALLOON NEPHROSTOMY (BALLOONS) IMPLANT
BASKET LASER NITINOL 1.9FR (BASKET) IMPLANT
BSKT STON RTRVL 120 1.9FR (BASKET)
CATH FOLEY 2W COUNCIL 20FR 5CC (CATHETERS) ×2 IMPLANT
CATH INTERMIT  6FR 70CM (CATHETERS) IMPLANT
CLOTH BEACON ORANGE TIMEOUT ST (SAFETY) ×3 IMPLANT
FIBER LASER FLEXIVA 365 (UROLOGICAL SUPPLIES) IMPLANT
FIBER LASER TRAC TIP (UROLOGICAL SUPPLIES) IMPLANT
GLOVE BIO SURGEON STRL SZ7.5 (GLOVE) ×3 IMPLANT
GOWN STRL REUS W/TWL LRG LVL3 (GOWN DISPOSABLE) ×3 IMPLANT
GUIDEWIRE ANG ZIPWIRE 038X150 (WIRE) ×3 IMPLANT
GUIDEWIRE STR DUAL SENSOR (WIRE) ×3 IMPLANT
IV NS 1000ML (IV SOLUTION) ×3
IV NS 1000ML BAXH (IV SOLUTION) ×1 IMPLANT
IV NS IRRIG 3000ML ARTHROMATIC (IV SOLUTION) ×3 IMPLANT
KIT TURNOVER CYSTO (KITS) ×3 IMPLANT
MANIFOLD NEPTUNE II (INSTRUMENTS) ×3 IMPLANT
NS IRRIG 500ML POUR BTL (IV SOLUTION) ×3 IMPLANT
PACK CYSTO (CUSTOM PROCEDURE TRAY) ×3 IMPLANT
SYR 10ML LL (SYRINGE) ×3 IMPLANT
TUBE CONNECTING 12'X1/4 (SUCTIONS) ×1
TUBE CONNECTING 12X1/4 (SUCTIONS) ×2 IMPLANT
TUBE FEEDING 8FR 16IN STR KANG (MISCELLANEOUS) IMPLANT
TUBING UROLOGY SET (TUBING) ×3 IMPLANT

## 2020-09-07 NOTE — Anesthesia Procedure Notes (Signed)
Procedure Name: LMA Insertion Date/Time: 09/07/2020 10:15 AM Performed by: Gerald Leitz, CRNA Pre-anesthesia Checklist: Patient identified, Patient being monitored, Timeout performed, Emergency Drugs available and Suction available Patient Re-evaluated:Patient Re-evaluated prior to induction Oxygen Delivery Method: Circle system utilized Preoxygenation: Pre-oxygenation with 100% oxygen Induction Type: IV induction Ventilation: Mask ventilation without difficulty LMA: LMA inserted LMA Size: 4.0 Tube type: Oral Number of attempts: 1 Placement Confirmation: positive ETCO2 and breath sounds checked- equal and bilateral Tube secured with: Tape Dental Injury: Teeth and Oropharynx as per pre-operative assessment

## 2020-09-07 NOTE — Transfer of Care (Signed)
Immediate Anesthesia Transfer of Care Note  Patient: Ronnie Curtis  Procedure(s) Performed: Procedure(s) with comments: CYSTOSCOPY/BILATERAL RETROGRADE/LEFT URETEROSCOPY and urethral dilation (Bilateral) - 1 HR  Patient Location: PACU  Anesthesia Type:General  Level of Consciousness: Alert, Awake, Oriented  Airway & Oxygen Therapy: Patient Spontanous Breathing  Post-op Assessment: Report given to RN  Post vital signs: Reviewed and stable  Last Vitals:  Vitals:   09/07/20 0842  BP: (!) 147/75  Pulse: (!) 49  Resp: 18  Temp: (!) 36.2 C  SpO2: 643%    Complications: No apparent anesthesia complications

## 2020-09-07 NOTE — Discharge Instructions (Signed)
1 - You may have urinary urgency (bladder spasms) and bloody urine on / off with catheter. This is normal.  2 - Call MD or go to ER for fever >102, severe pain / nausea / vomiting not relieved by medications, or acute change in medical status  CYSTOSCOPY HOME CARE INSTRUCTIONS  Activity: Rest for the remainder of the day.  Do not drive or operate equipment today.  You may resume normal activities in one to two days as instructed by your physician.   Meals: Drink plenty of liquids and eat light foods such as gelatin or soup this evening.  You may return to a normal meal plan tomorrow.  Return to Work: You may return to work in one to two days or as instructed by your physician.  Special Instructions / Symptoms: Call your physician if any of these symptoms occur:   -persistent or heavy bleeding  -bleeding which continues after first few urination  -large blood clots that are difficult to pass  -urine stream diminishes or stops completely  -fever equal to or higher than 101 degrees Farenheit.  -cloudy urine with a strong, foul odor  -severe pain  Females should always wipe from front to back after elimination.  You may feel some burning pain when you urinate.  This should disappear with time.  Applying moist heat to the lower abdomen or a hot tub bath may help relieve the pain. \  Follow-Up / Date of Return Visit to Your Physician:   Call for an appointment to arrange follow-up.   Post Anesthesia Home Care Instructions  Activity: Get plenty of rest for the remainder of the day. A responsible individual must stay with you for 24 hours following the procedure.  For the next 24 hours, DO NOT: -Drive a car -Paediatric nurse -Drink alcoholic beverages -Take any medication unless instructed by your physician -Make any legal decisions or sign important papers.  Meals: Start with liquid foods such as gelatin or soup. Progress to regular foods as tolerated. Avoid greasy, spicy, heavy  foods. If nausea and/or vomiting occur, drink only clear liquids until the nausea and/or vomiting subsides. Call your physician if vomiting continues.  Special Instructions/Symptoms: Your throat may feel dry or sore from the anesthesia or the breathing tube placed in your throat during surgery. If this causes discomfort, gargle with warm salt water. The discomfort should disappear within 24 hours.  If you had a scopolamine patch placed behind your ear for the management of post- operative nausea and/or vomiting:  1. The medication in the patch is effective for 72 hours, after which it should be removed.  Wrap patch in a tissue and discard in the trash. Wash hands thoroughly with soap and water. 2. You may remove the patch earlier than 72 hours if you experience unpleasant side effects which may include dry mouth, dizziness or visual disturbances. 3. Avoid touching the patch. Wash your hands with soap and water after contact with the patch.

## 2020-09-07 NOTE — H&P (Signed)
Ronnie Curtis is an 69 y.o. male.    Chief Complaint: Pre-Op LEFT diagnostic ureteroscopy  HPI:   1 - Mild Incidental Left Hydronephrosis - mild hydro [facility] at [facility] 05/2020 during eval for pancreatitis. , no masses mentioned. CT 14-Feb-2014 w/o hydro or stones. Non-smoker.   2  Urethral Stricture - s/p urethrotomy x several over the years, no bother in decades.   PMH sig for recurrent alcoholic pancreatitis, obesity, TNA. NO CV disease / blood thinners. 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 His PCP is Dr. Ronnald Ramp with [facility].   Today " Yidel " is seen to proceed with cysto, retrograds, left ureteroscoy to r/o intraluminal lesions contributng to incidental left hydro. No interval fevvers. C19 screen negative. Most recent UA without infectious parameters.   Past Medical History:  Diagnosis Date  . Allergy    grass, mold, dust  . Anxiety    Panic disorder  . Arthritis    "back" (04/13/2014)  . Asthmatic bronchitis    no inhaler use  . Borderline diabetes   . Chronic lower back pain   . Depression ~ Feb 15, 2007   "when granddaughter died"   . GERD (gastroesophageal reflux disease)   . History of heart murmur in childhood    not heard in years  . Hydronephrosis, left   . Hypogonadism male   . Multiple polyps of ethmoid sinus   . Obesity   . OSA (obstructive sleep apnea)    PSG 05/24/10 AHI 90, CPAP 13cm H2O- hasn't used CPAP in years 14-Feb-2014)  . Pneumonia last  time 02/15/2015   "couple times" (04/13/2014)  . Retinal detachment    "multiple"  . Tubular adenoma of colon    2 in 02-15-2004 and 3 in February 15, 2007    Past Surgical History:  Procedure Laterality Date  . CARPAL TUNNEL RELEASE Left yrs ago  . CATARACT EXTRACTION W/ INTRAOCULAR LENS  IMPLANT, BILATERAL  February 15, 2003  . COLONOSCOPY W/ BIOPSIES     multiple  . cross linking to corneras Bilateral 02/15/16   at [facility]  . LASER PHOTO ABLATION Left 04/13/2014   Procedure:  LASER PHOTO ABLATION;  Surgeon: Hayden Pedro, MD;  Location: [place];  Service: Ophthalmology;  Laterality: Left;  . NASAL RECONSTRUCTION WITH SEPTAL REPAIR  1970's X 2  . NASAL SEPTUM SURGERY  05/14/2005   bilateral polypectomy, bilateral total ethmoidectomy, left frontal sinusotomy  . PARS PLANA VITRECTOMY Left 04/13/2014   Procedure: PARS PLANA VITRECTOMY WITH 25 GAUGE;  Surgeon: Hayden Pedro, MD;  Location: [place];  Service: Ophthalmology;  Laterality: Left;  . REPAIR OF COMPLEX TRACTION RETINAL DETACHMENT Left 04/13/2014   Procedure: REPAIR OF COMPLEX TRACTION RETINAL DETACHMENT;  Surgeon: Hayden Pedro, MD;  Location: [place];  Service: Ophthalmology;  Laterality: Left;  . RETINAL DETACHMENT SURGERY Left    "4 on the left now; 2 on the right" (04/13/2014)  . TONSILLECTOMY AND ADENOIDECTOMY  as child  . URETHROTOMY     x3  . VASECTOMY  yrs ago    Family History  Problem Relation Age of Onset  . Allergies Mother   . Lymphoma Mother   . Cancer Mother        lymphoma  . Heart disease Father        CAD, CHF  . Hypertension Other   . Hypertension Brother   . Arthritis Brother  OA  . Colon cancer Neg Hx   . Other Neg Hx        low testosterone   Social History:  reports that he has never smoked. He has never used smokeless tobacco. He reports previous alcohol use of about 2.0 standard drinks of alcohol per week. He reports current drug use. Drug: Marijuana.  Allergies:  Allergies  Allergen Reactions  . Moviprep [Peg-Kcl-Nacl-Nasulf-Na Asc-C] Nausea And Vomiting  . Cialis [Tadalafil]     HAs  . Erythromycin Other (See Comments)    Sever reflux  . Maxzide [Triamterene-Hctz]     cramps  . Spironolactone     gynecomastia  . Triamterene-Hydrochlorothiazide  [Hydrochlorothiazide W-Triamterene]     Other reaction(s): Unknown cramps    No medications prior to admission.    Results for orders placed or performed during the hospital encounter of 09/05/20 (from the past 48  hour(s))  SARS CORONAVIRUS 2 (TAT 6-24 HRS) Nasopharyngeal Nasopharyngeal Swab     Status: None   Collection Time: 09/05/20  8:30 AM   Specimen: Nasopharyngeal Swab  Result Value Ref Range   SARS Coronavirus 2 NEGATIVE NEGATIVE    Comment: (NOTE) SARS-CoV-2 target nucleic acids are NOT DETECTED.  The SARS-CoV-2 RNA is generally detectable in upper and lower respiratory specimens during the acute phase of infection. Negative results do not preclude SARS-CoV-2 infection, do not rule out co-infections with other pathogens, and should not be used as the sole basis for treatment or other patient management decisions. Negative results must be combined with clinical observations, patient history, and epidemiological information. The expected result is Negative.  Fact Sheet for Patients: SugarRoll.be  Fact Sheet for Healthcare Providers: https://www.woods-mathews.com/  This test is not yet approved or cleared by the [place] FDA and  has been authorized for detection and/or diagnosis of SARS-CoV-2 by FDA under an Emergency Use Authorization (EUA). This EUA will remain  in effect (meaning this test can be used) for the duration of the COVID-19 declaration under Se ction 564(b)(1) of the Act, 21 U.S.C. section 360bbb-3(b)(1), unless the authorization is terminated or revoked sooner.  Performed at [facility], [place] [address] [place], [place] [address]    No results found.  Review of Systems  Constitutional: Negative for chills and fever.  All other systems reviewed and are negative.   Height 5\' 6"  (1.676 m), weight 104.3 kg. Physical Exam Vitals reviewed.  HENT:     Head: Normocephalic.     Nose: Nose normal.  Eyes:     Pupils: Pupils are equal, round, and reactive to light.  Cardiovascular:     Rate and Rhythm: Normal rate.     Pulses: Normal pulses.  Pulmonary:     Effort: Pulmonary effort is normal.  Abdominal:      General: Abdomen is flat.  Genitourinary:    Comments: NO CVAT at present.  Musculoskeletal:        General: Normal range of motion.     Cervical back: Normal range of motion.  Skin:    General: Skin is warm.  Neurological:     General: No focal deficit present.     Mental Status: He is alert.  Psychiatric:        Mood and Affect: Mood normal.      Assessment/Plan  Proceed as planned with cysto, bilateral retrogards and LEFT ureteroscopy. Risks, benefits, alternatives, expected peri-op course discussed previously and reiterated today.   Alexis Frock, MD 09/07/2020, 5:13 AM

## 2020-09-07 NOTE — Anesthesia Preprocedure Evaluation (Addendum)
Anesthesia Evaluation  Patient identified by MRN, date of birth, ID band Patient awake    Reviewed: Allergy & Precautions, NPO status , Patient's Chart, lab work & pertinent test results  Airway Mallampati: II  TM Distance: >3 FB Neck ROM: Full    Dental  (+) Poor Dentition, Missing   Pulmonary asthma , sleep apnea , Patient abstained from smoking.,    Pulmonary exam normal breath sounds clear to auscultation       Cardiovascular hypertension, Normal cardiovascular exam Rhythm:Regular Rate:Normal     Neuro/Psych PSYCHIATRIC DISORDERS Anxiety Depression    GI/Hepatic GERD  ,  Endo/Other  diabetesObesity   Renal/GU Renal disease     Musculoskeletal  (+) Arthritis ,   Abdominal (+) + obese,   Peds  Hematology negative hematology ROS (+)   Anesthesia Other Findings   Reproductive/Obstetrics negative OB ROS                            Anesthesia Physical Anesthesia Plan  ASA: III  Anesthesia Plan: General   Post-op Pain Management:    Induction:   PONV Risk Score and Plan: Ondansetron, Dexamethasone, Treatment may vary due to age or medical condition and Midazolam  Airway Management Planned: LMA  Additional Equipment: None  Intra-op Plan:   Post-operative Plan: Extubation in OR  Informed Consent: I have reviewed the patients History and Physical, chart, labs and discussed the procedure including the risks, benefits and alternatives for the proposed anesthesia with the patient or authorized representative who has indicated his/her understanding and acceptance.     Dental advisory given  Plan Discussed with: Anesthesiologist and CRNA  Anesthesia Plan Comments:        Anesthesia Quick Evaluation

## 2020-09-07 NOTE — Op Note (Signed)
NAME: Ronnie Curtis, Ronnie Curtis MEDICAL RECORD [id number] ACCOUNT [id number] DATE OF BIRTH:07-14-1951 FACILITY: [facility] LOCATION: [facility] PHYSICIAN:Cyerra Yim Tresa Moore, MD  OPERATIVE REPORT  DATE OF PROCEDURE:  09/07/2020  SURGEON:  Alexis Frock, MD  PREOPERATIVE DIAGNOSES:   1.  Mild left hydronephrosis.   2.  History of ureteral stricture.  POSTOPERATIVE DIAGNOSES:   1.  Left ureteral tortuosity.   2.  Recurrence of urethral stricture.  ESTIMATED BLOOD LOSS:  Nil.  COMPLICATIONS:  None.  SPECIMENS:  None.  PROCEDURE: 1.  Cystoscopy, bilateral retrograde pyelograms, interpretation. 2.  Left diagnostic ureteroscopy. 3.  Urethral stricture dilation with catheter placement, complicated.  INDICATIONS:  The patient is a pleasant 69 year old veteran who was found incidentally to have some mild left hydronephrosis with questionable distal ureteral abnormality worrisome for possible neoplasia.  He was referred for further diagnostic  management.  He also does have a history of urethral stricture disease, minimal baseline voiding complaints.  Options were discussed including recommended path of left diagnostic ureteroscopy with bilateral retrograde with goal of ruling out any  intraluminal neoplasia on the left side and he wished to proceed.  Informed consent was obtained and placed in the medical record.  DESCRIPTION OF PROCEDURE:  The patient being identified the procedure being bilateral retrogrades, cystoscopy, left diagnostic ureteroscopy was confirmed.  Procedure timeout was performed.  Intravenous antibiotics administered.  General anesthesia  induced.  The patient was placed into a low lithotomy position.  Sterile field was created, prepped and draped base of the penis, perineum and proximal thighs using iodine.  Cystourethroscopy was performed 21-French rigid cystoscope with offset lens.   Inspection of anterior and posterior urethra revealed a significant long segment stricture  disease beginning in the proximal third of the pendulous urethra.  This would not accommodate a 21-French scope.  The estimated diameter of the lumen was  approximately 8 to 10-French.  A sensor wire was then advanced using combination of fluoroscopic and cystoscopic guidance to the level of the urinary bladder.  A 24-French balloon dilation apparatus was advanced across the level of the stricture again   using cystoscopic and fluoroscopic guidance.  This was inflated to a pressure of 20 atmospheres, held for 90 seconds and then released and cystoscopy then revealed resolution of the stricture to 24-French.  Again, it was a fairly long segment extending  from the bulbar urethra to the proximal third of the pendulous urethra.  Prostatic urethra was wide open.  Inspection of bladder revealed no diverticula, calcifications, papillary lesions.  Ureteral orifices were singleton bilaterally.  The right  ureteral orifice was cannulated with a 6-French renal catheter and right retrograde pyelogram was obtained.  Right retrograde pyelogram demonstrated a single right ureter, single system right kidney.  No obvious filling defects or narrowing noted.  Similarly, left retrograde pyelogram was obtained.  Left retrograde pyelogram demonstrated a single left ureter with single system left kidney.  There was an amazing amount of tortuosity of the left ureter with J hooking, more impressive than I have ever before seen.  Fortunately, there was no obvious  intraluminal filling defects nor was there any significant hydronephrosis.  I felt that was likely physiologic tortuosity.  A 0.03 ZIPwire was advanced to the level of the upper pole.  This did not result in straightening of the ureter.  A semirigid  ureteroscopy was then performed of the distal apex of the ureter.  No mucosal abnormalities were found and a sensor wire was also advanced alongside the ZIPwire to the level  of the upper pole.  Again, the ureter would not  straighten.  The semirigid scope  was then exchanged for a single channel flexible digital ureteroscope, which was able to inspect the distal third of the ureter, well proximal to the area of prior concern of abnormality on the CAT scan.  There was no evidence of intraluminal lesions  seen whatsoever.  Due to the tortuosity, the flexible scope would not go proximal to this.  However, I was very satisfied with the evaluation.  Again, we had ruled out intraluminal neoplasia, his imaging findings only being of result of significant  tortuosity, which does not appear to be a result of high-grade obstruction.  The ureteroscope was removed, as were the safety wire and finally a new 20-French Councill tip catheter was placed over a sensor wire that was placed to the level of the urinary  bladder.  Ten mL of water were placed in the balloon and the procedure was terminated.  The patient tolerated the procedure well.  There were no immediate perioperative complications.  The patient was taken to postanesthesia care unit in stable  condition.  Plan for discharge home with catheter and likely trial of void in 5 days.  VN/NUANCE  D:09/07/2020 T:09/07/2020 JOB:[id number]

## 2020-09-07 NOTE — Brief Op Note (Signed)
09/07/2020  10:41 AM  PATIENT:  Ronnie Curtis  69 y.o. male  PRE-OPERATIVE DIAGNOSIS:  LEFT HYDRONEPHROSIS  POST-OPERATIVE DIAGNOSIS:  LEFT HYDRONEPHROSIS  PROCEDURE:  Procedure(s) with comments: CYSTOSCOPY/BILATERAL RETROGRADE/LEFT URETEROSCOPY and urethral dilation (Bilateral) - 1 HR  SURGEON:  Surgeon(s) and Role:    Alexis Frock, MD - Primary  PHYSICIAN ASSISTANT:   ASSISTANTS: none   ANESTHESIA:   general  EBL:  minimal   BLOOD ADMINISTERED:none  DRAINS: 56F foley to gravity   LOCAL MEDICATIONS USED:  NONE  SPECIMEN:  No Specimen  DISPOSITION OF SPECIMEN:  N/A  COUNTS:  YES  TOURNIQUET:  * No tourniquets in log *  DICTATION: .Other Dictation: Dictation Number  [id number]  PLAN OF CARE: Discharge to home after PACU  PATIENT DISPOSITION:  PACU - hemodynamically stable.   Delay start of Pharmacological VTE agent (>24hrs) due to surgical blood loss or risk of bleeding: yes

## 2020-09-07 NOTE — Anesthesia Postprocedure Evaluation (Signed)
Anesthesia Post Note  Patient: Ronnie Curtis  Procedure(s) Performed: CYSTOSCOPY/BILATERAL RETROGRADE/LEFT URETEROSCOPY and urethral dilation (Bilateral )     Patient location during evaluation: PACU Anesthesia Type: General Level of consciousness: sedated Pain management: pain level controlled Vital Signs Assessment: post-procedure vital signs reviewed and stable Respiratory status: spontaneous breathing and respiratory function stable Cardiovascular status: stable Postop Assessment: no apparent nausea or vomiting Anesthetic complications: no   No complications documented.  Last Vitals:  Vitals:   09/07/20 1115 09/07/20 1130  BP: (!) 146/84 (!) 160/78  Pulse: (!) 51 (!) 51  Resp: 15 12  Temp:    SpO2: 98% 99%    Last Pain:  Vitals:   09/07/20 1130  TempSrc:   PainSc: 0-No pain                 Merlinda Frederick

## 2020-09-08 ENCOUNTER — Encounter (HOSPITAL_BASED_OUTPATIENT_CLINIC_OR_DEPARTMENT_OTHER): Payer: Self-pay | Admitting: Urology

## 2022-07-17 ENCOUNTER — Encounter (INDEPENDENT_AMBULATORY_CARE_PROVIDER_SITE_OTHER): Payer: No Typology Code available for payment source | Admitting: Ophthalmology

## 2022-07-17 DIAGNOSIS — H59032 Cystoid macular edema following cataract surgery, left eye: Secondary | ICD-10-CM | POA: Diagnosis not present

## 2022-07-17 DIAGNOSIS — H33333 Multiple defects of retina without detachment, bilateral: Secondary | ICD-10-CM

## 2022-07-17 DIAGNOSIS — H43813 Vitreous degeneration, bilateral: Secondary | ICD-10-CM

## 2022-07-17 DIAGNOSIS — H35371 Puckering of macula, right eye: Secondary | ICD-10-CM | POA: Diagnosis not present
